# Patient Record
Sex: Female | Born: 2000
Health system: Southern US, Community
[De-identification: ages and names within clinical notes are randomized; demographics above are authoritative.]

## PROBLEM LIST (undated history)

## (undated) DIAGNOSIS — J302 Other seasonal allergic rhinitis: Secondary | ICD-10-CM

## (undated) DIAGNOSIS — Z9109 Other allergy status, other than to drugs and biological substances: Secondary | ICD-10-CM

## (undated) DIAGNOSIS — K5900 Constipation, unspecified: Secondary | ICD-10-CM

## (undated) HISTORY — DX: Other seasonal allergic rhinitis: J30.2

## (undated) HISTORY — PX: OTHER SURGICAL HISTORY: SHX169

## (undated) HISTORY — DX: Constipation, unspecified: K59.00

## (undated) HISTORY — DX: Other allergy status, other than to drugs and biological substances: Z91.09

---
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
## 2018-05-28 ENCOUNTER — Telehealth: Payer: Self-pay | Admitting: Family Medicine

## 2018-05-28 NOTE — Telephone Encounter (Signed)
Copied from CRM [phone]. Topic: Appointment Scheduling - Scheduling Inquiry for Clinic >> May 27, 2018  4:00 PM Eston Mouldavis, Cheri B wrote: Reason for CRM: PT's mother would like to have a cpe apt with Dr Carmelia RollerWendling before Wednesday for school  but I do not see any availability.

## 2018-05-28 NOTE — Telephone Encounter (Signed)
Called Patient Mother and requested if you would approved of another provider would doing a CPE for this patient.

## 2018-05-28 NOTE — Telephone Encounter (Signed)
OK. 11:15 or 2:15 Mon would work. TY.

## 2018-05-31 ENCOUNTER — Encounter: Payer: Self-pay | Admitting: Family Medicine

## 2018-05-31 ENCOUNTER — Ambulatory Visit (INDEPENDENT_AMBULATORY_CARE_PROVIDER_SITE_OTHER): Payer: BLUE CROSS/BLUE SHIELD | Admitting: Family Medicine

## 2018-05-31 VITALS — BP 118/70 | HR 70 | Temp 98.8°F | Ht 64.0 in | Wt 125.2 lb

## 2018-05-31 DIAGNOSIS — Z0000 Encounter for general adult medical examination without abnormal findings: Secondary | ICD-10-CM | POA: Diagnosis not present

## 2018-05-31 DIAGNOSIS — Z23 Encounter for immunization: Secondary | ICD-10-CM

## 2018-05-31 DIAGNOSIS — Z114 Encounter for screening for human immunodeficiency virus [HIV]: Secondary | ICD-10-CM

## 2018-05-31 NOTE — Progress Notes (Signed)
Pre visit review using our clinic review tool, if applicable. No additional management support is needed unless otherwise documented below in the visit note. 

## 2018-05-31 NOTE — Progress Notes (Signed)
Chief Complaint  Patient presents with  . Annual Exam     Well Woman Susan Galloway is here for a complete physical.   Her last physical was >1 year ago.  Current diet: in general, a "healthy" diet. Current exercise: Zumba, dancing. Weight is stable and she denies daytime fatigue. No LMP recorded. Seatbelt? Yes  Health Maintenance Tetanus- Yes HIV screening- No  Past Medical History:  Diagnosis Date  . Constipation   . Environmental allergies   . Seasonal allergies      Past Surgical History:  Procedure Laterality Date  . Dental surgery      Medications  Takes no meds routinely.    Allergies Allergies  Allergen Reactions  . Sulfa Antibiotics Hives    Review of Systems: Constitutional:  no unexpected weight changes Eye:  no recent significant change in vision Ear/Nose/Mouth/Throat:  Ears:  no tinnitus or vertigo and no recent change in hearing Nose/Mouth/Throat:  no complaints of nasal congestion, no sore throat Cardiovascular: no chest pain Respiratory:  no cough and no shortness of breath Gastrointestinal:  no abdominal pain, no change in bowel habits GU:  Female: negative for dysuria or pelvic pain Musculoskeletal/Extremities:  no pain of the joints Integumentary (Skin/Breast):  no abnormal skin lesions reported Neurologic:  no headaches Endocrine:  denies fatigue Hematologic/Lymphatic:  No areas of easy bleeding  Exam BP 118/70 (BP Location: Left Arm, Patient Position: Sitting, Cuff Size: Normal)   Pulse 70   Temp 98.8 F (37.1 C) (Oral)   Ht 5\' 4"  (1.626 m)   Wt 125 lb 4 oz (56.8 kg)   SpO2 98%   BMI 21.50 kg/m  General:  well developed, well nourished, in no apparent distress Skin:  no significant moles, warts, or growths Head:  no masses, lesions, or tenderness Eyes:  pupils equal and round, sclera anicteric without injection Ears:  canals without lesions, TMs shiny without retraction, no obvious effusion, no erythema Nose:  nares patent,  septum midline, mucosa normal, and no drainage or sinus tenderness Throat/Pharynx:  lips and gingiva without lesion; tongue and uvula midline; non-inflamed pharynx; no exudates or postnasal drainage Neck: neck supple without adenopathy, thyromegaly, or masses Lungs:  clear to auscultation, breath sounds equal bilaterally, no respiratory distress Cardio:  regular rate and rhythm, no bruits, no LE edema Abdomen:  abdomen soft, nontender; bowel sounds normal; no masses or organomegaly Genital: Defer to GYN Musculoskeletal:  symmetrical muscle groups noted without atrophy or deformity Extremities:  no clubbing, cyanosis, or edema, no deformities, no skin discoloration Neuro:  gait normal; deep tendon reflexes normal and symmetric Psych: well oriented with normal range of affect and appropriate judgment/insight  Assessment and Plan  Well adult exam - Plan: Lipid panel, CBC, Comprehensive metabolic panel  Screening for HIV (human immunodeficiency virus) - Plan: HIV antibody   Well 17 y.o. female. Counseled on diet and exercise. Update Meningitis and HPV vaccine. Should be good to go for [place]. Other orders as above. Follow up in 1 yr pending above. The patient voiced understanding and agreement to the plan.  Jilda Rocheicholas Paul [place], DO 05/31/18 3:24 PM

## 2018-05-31 NOTE — Telephone Encounter (Signed)
Call patient and adv per PCP approved. Patient stts she would like to take that appointment.

## 2018-05-31 NOTE — Patient Instructions (Addendum)
Keep up the good work.  Have a great time in [place]! Stay safe.  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  Give us 2-3 business days to get the results of your labs back. If labs are normal, you will likely receive a letter in the mail unless you have MyChart. This can take longer than 2-3 business days.   Let us know if you need anything.

## 2018-05-31 NOTE — Addendum Note (Signed)
Addended by: Scharlene GlossEWING, ROBIN B on: 05/31/2018 03:46 PM   Modules accepted: Orders

## 2018-06-01 LAB — LIPID PANEL
Cholesterol: 152 mg/dL (ref 0–200)
HDL: 67.9 mg/dL (ref >39.00)
LDL Cholesterol: 68 mg/dL (ref 0–99)
NonHDL: 84.05
Total CHOL/HDL Ratio: 2
Triglycerides: 79 mg/dL (ref 0.0–149.0)
VLDL: 15.8 mg/dL (ref 0.0–40.0)

## 2018-06-01 LAB — CBC
HCT: 39.7 % (ref 36.0–49.0)
Hemoglobin: 12.8 g/dL (ref 12.0–16.0)
MCHC: 32.2 g/dL (ref 31.0–37.0)
MCV: 81.9 fl (ref 78.0–98.0)
Platelets: 276 10*3/uL (ref 150.0–575.0)
RBC: 4.84 Mil/uL (ref 3.80–5.70)
RDW: 15.4 % (ref 11.4–15.5)
WBC: 7.7 10*3/uL (ref 4.5–13.5)

## 2018-06-01 LAB — COMPREHENSIVE METABOLIC PANEL
ALT: 7 U/L (ref 0–35)
AST: 14 U/L (ref 0–37)
Albumin: 4.4 g/dL (ref 3.5–5.2)
Alkaline Phosphatase: 29 U/L — ABNORMAL LOW (ref 47–119)
BUN: 17 mg/dL (ref 6–23)
CO2: 29 meq/L (ref 19–32)
Calcium: 9.9 mg/dL (ref 8.4–10.5)
Chloride: 104 meq/L (ref 96–112)
Creatinine, Ser: 0.81 mg/dL (ref 0.40–1.20)
GFR: 97.01 mL/min (ref >60.00)
Glucose, Bld: 78 mg/dL (ref 70–99)
Potassium: 4.7 meq/L (ref 3.5–5.1)
Sodium: 140 meq/L (ref 135–145)
Total Bilirubin: 1.5 mg/dL — ABNORMAL HIGH (ref 0.3–1.2)
Total Protein: 7 g/dL (ref 6.0–8.3)

## 2018-06-01 LAB — HIV ANTIBODY (ROUTINE TESTING W REFLEX): HIV 1&2 Ab, 4th Generation: NONREACTIVE

## 2018-10-18 ENCOUNTER — Encounter: Payer: Self-pay | Admitting: Family Medicine

## 2018-10-18 ENCOUNTER — Ambulatory Visit (INDEPENDENT_AMBULATORY_CARE_PROVIDER_SITE_OTHER): Payer: BLUE CROSS/BLUE SHIELD | Admitting: Family Medicine

## 2018-10-18 VITALS — BP 110/80 | HR 99 | Temp 98.7°F | Ht 64.0 in | Wt 128.1 lb

## 2018-10-18 DIAGNOSIS — L709 Acne, unspecified: Secondary | ICD-10-CM

## 2018-10-18 MED ORDER — CLINDAMYCIN PHOSPHATE 1 % EX GEL: Freq: Two times a day (BID) | CUTANEOUS | 1 refills | Status: DC

## 2018-10-18 NOTE — Progress Notes (Signed)
Pre visit review using our clinic review tool, if applicable. No additional management support is needed unless otherwise documented below in the visit note. 

## 2018-10-18 NOTE — Patient Instructions (Addendum)
Don't pick at your acne!  Stay on the Benzoyl peroxide.  Let us know if you need anything.  Acne Acne is a skin problem that causes pimples. Acne occurs when the pores in the skin get blocked. The pores may become infected with bacteria, or they may become red, sore, and swollen. Acne is a common skin problem, especially for teenagers. Acne usually goes away over time. What are the causes? Each pore contains an oil gland. Oil glands make an oily substance that is called sebum. Acne happens when these glands get plugged with sebum, dead skin cells, and dirt. Then, the bacteria that are normally found in the oil glands multiply and cause inflammation. Acne is commonly triggered by changes in your hormones. These hormonal changes can cause the oil glands to get bigger and to make more sebum. Factors that can make acne worse include:  Hormone changes during: ? Adolescence. ? Women's menstrual cycles. ? Pregnancy.  Oil-based cosmetics and hair products.  Harshly scrubbing the skin.  Strong soaps.  Stress.  Hormone problems that are due to certain diseases.  Long or oily hair rubbing against the skin.  Certain medicines.  Pressure from headbands, backpacks, or shoulder pads.  Exposure to certain oils and chemicals.  What increases the risk? This condition is more likely to develop in:  Teenagers.  People who have a family history of acne.  What are the signs or symptoms? Acne often occurs on the face, neck, chest, and upper back. Symptoms include:  Small, red bumps (pimples or papules).  Whiteheads.  Blackheads.  Small, pus-filled pimples (pustules).  Big, red pimples or pustules that feel tender.  More severe acne can cause:  An infected area that contains a collection of pus (abscess).  Hard, painful, fluid-filled sacs (cysts).  Scars.  How is this diagnosed? This condition is diagnosed with a medical history and physical exam. Blood tests may also be  done. How is this treated? Treatment for this condition can vary depending on the severity of your acne. Treatment may include:  Creams and lotions that prevent oil glands from clogging.  Creams and lotions that treat or prevent infections and inflammation.  Antibiotic medicines that are applied to the skin or taken as a pill.  Pills that decrease sebum production.  Birth control pills.  Light or laser treatments.  Surgery.  Injections of medicine into the affected areas.  Chemicals that cause peeling of the skin.  Your health care provider will also recommend the best way to take care of your skin. Good skin care is the most important part of treatment. Follow these instructions at home: Skin care Take care of your skin as told by your health care provider. You may be told to do these things:  Wash your skin gently at least two times each day, as well as: ? After you exercise. ? Before you go to bed.  Use mild soap.  Apply a water-based skin moisturizer after you wash your skin.  Use a sunscreen or sunblock with SPF 30 or greater. This is especially important if you are using acne medicines.  Choose cosmetics that will not plug your oil glands (are noncomedogenic).  Medicines  Take over-the-counter and prescription medicines only as told by your health care provider.  If you were prescribed an antibiotic medicine, apply or take it as told by your health care provider. Do not stop taking the antibiotic even if your condition improves. General instructions  Keep your hair clean and off  of your face. If you have oily hair, shampoo your hair regularly or daily.  Avoid leaning your chin or forehead against your hands.  Avoid wearing tight headbands or hats.  Avoid picking or squeezing your pimples. That can make your acne worse and cause scarring.  Keep all follow-up visits as told by your health care provider. This is important.  Shave gently and only when  necessary.  Keep a food journal to figure out if any foods are linked with your acne. Contact a health care provider if:  Your acne is not better after eight weeks.  Your acne gets worse.  You have a large area of skin that is red or tender.  You think that you are having side effects from any acne medicine. This information is not intended to replace advice given to you by your health care provider. Make sure you discuss any questions you have with your health care provider. Document Released: 12/12/2000 Document Revised: 08/15/2016 Document Reviewed: 02/21/2015 Elsevier Interactive Patient Education  [employer].

## 2018-10-18 NOTE — Progress Notes (Signed)
Chief Complaint  Patient presents with  . Acne    shoulders and back    Susan Galloway is a 17 y.o. female here for a skin complaint.  Duration: 3 weeks got worse after brother's wedding Location: back and shoulders Acne New soaps/lotions/topicals/detergents? No Other associated symptoms: sweating, stress Therapies tried thus far: benzoyl peroxide  ROS:  Const: No fevers Skin: As noted in HPI  Past Medical History:  Diagnosis Date  . Constipation   . Environmental allergies   . Seasonal allergies     BP 110/80 (BP Location: Left Arm, Patient Position: Sitting, Cuff Size: Normal)   Pulse 99   Temp 98.7 F (37.1 C) (Oral)   Ht 5\' 4"  (1.626 m)   Wt 128 lb 2 oz (58.1 kg)   SpO2 98%   BMI 21.99 kg/m  Gen: awake, alert, appearing stated age Lungs: No accessory muscle use Skin: acne noted on upper back and shoulders, open comedones. Scarring noted - pt admits she was picking at those areas. No drainage, erythema, TTP, fluctuance, excoriation Psych: Age appropriate judgment and insight  Acne, unspecified acne type - Plan: clindamycin (CLINDAGEL) 1 % gel  Orders as above.  Cont BP. Do not pick at lesions. F/u in 1 mo to reck. The patient voiced understanding and agreement to the plan.  Susan Roche [place], DO 10/18/18 3:46 PM

## 2018-11-18 ENCOUNTER — Encounter: Payer: Self-pay | Admitting: Family Medicine

## 2018-11-18 ENCOUNTER — Ambulatory Visit (INDEPENDENT_AMBULATORY_CARE_PROVIDER_SITE_OTHER): Payer: BLUE CROSS/BLUE SHIELD | Admitting: Family Medicine

## 2018-11-18 VITALS — BP 108/80 | HR 77 | Temp 98.6°F | Ht 64.0 in | Wt 127.4 lb

## 2018-11-18 DIAGNOSIS — L709 Acne, unspecified: Secondary | ICD-10-CM | POA: Diagnosis not present

## 2018-11-18 MED ORDER — MINOCYCLINE HCL 100 MG PO TABS: 100.0000 mg | ORAL_TABLET | Freq: Two times a day (BID) | ORAL | 1 refills | Status: DC

## 2018-11-18 NOTE — Progress Notes (Signed)
Pre visit review using our clinic review tool, if applicable. No additional management support is needed unless otherwise documented below in the visit note. 

## 2018-11-18 NOTE — Progress Notes (Signed)
Chief Complaint  Patient presents with  . Acne    Subjective: Patient is a 17 y.o. female here for f/u acne.  Did use Clindagel, was expensive so used niece's supply. Feels it did get better overall. No AE's. Reports compliance. Still doing benzoyl peroxide washes daily.  No new acne on shoulders/upper back. +breakout over face.   ROS: Skin: +acne  Past Medical History:  Diagnosis Date  . Constipation   . Environmental allergies   . Seasonal allergies     Objective: BP 108/80 (BP Location: Left Arm, Patient Position: Sitting, Cuff Size: Normal)   Pulse 77   Temp 98.6 F (37 C) (Oral)   Ht 5\' 4"  (1.626 m)   Wt 127 lb 6 oz (57.8 kg)   SpO2 98%   BMI 21.86 kg/m  General: Awake, appears stated age Skin: +hyperpigmented patches/macules on upper back, small comedones over same area, face and shoulders Lungs: No accessory muscle use Psych: Age appropriate judgment and insight, normal affect and mood  Assessment and Plan: Acne, unspecified acne type - Plan: minocycline (DYNACIN) 100 MG tablet  Stop clindagel 2/2 cost. Will try PO mino. Scar/Vit E over post inflammatory hyperpigmentation. Cont benz peroxide washes. F/u in 6 weeks. The patient voiced understanding and agreement to the plan.  Jilda Rocheicholas [place], DO 11/18/18  2:47 PM

## 2018-11-18 NOTE — Patient Instructions (Addendum)
Let me know if the medicine is too expensive.  Continue the washes.  Practice good hygiene.  Try topical Vit E or scar cream/oil over the dark patches. This could take several months to fully resolve.   Let Susan Galloway know if you need anything.

## 2018-12-30 ENCOUNTER — Ambulatory Visit: Payer: BLUE CROSS/BLUE SHIELD | Admitting: Family Medicine

## 2019-01-13 ENCOUNTER — Ambulatory Visit (INDEPENDENT_AMBULATORY_CARE_PROVIDER_SITE_OTHER): Payer: BLUE CROSS/BLUE SHIELD | Admitting: Family Medicine

## 2019-01-13 ENCOUNTER — Encounter: Payer: Self-pay | Admitting: Family Medicine

## 2019-01-13 VITALS — BP 102/72 | HR 76 | Temp 98.4°F | Ht 64.0 in | Wt 126.2 lb

## 2019-01-13 DIAGNOSIS — R0789 Other chest pain: Secondary | ICD-10-CM

## 2019-01-13 DIAGNOSIS — R071 Chest pain on breathing: Secondary | ICD-10-CM | POA: Diagnosis not present

## 2019-01-13 MED ORDER — CYCLOBENZAPRINE HCL 10 MG PO TABS: 5.0000 mg | ORAL_TABLET | Freq: Three times a day (TID) | ORAL | 0 refills | Status: DC | PRN

## 2019-01-13 MED ORDER — MELOXICAM 15 MG PO TABS: 15.0000 mg | ORAL_TABLET | Freq: Every day | ORAL | 0 refills | Status: DC

## 2019-01-13 NOTE — Progress Notes (Signed)
Musculoskeletal Exam  Patient: Susan Galloway DOB: May 24, 2001  DOS: 01/13/2019  SUBJECTIVE:  Chief Complaint:   Chief Complaint  Patient presents with  . Muscle Pain    Susan Galloway is a 18 y.o.  female for evaluation and treatment of central chest pain.   Onset:  1 week ago. Pulling couches.   Location: central  Character:  sharp  Progression of issue:  is unchanged Associated symptoms: sometimes hurts to take a deep breath Treatment: to date has been hot pad, hot tea, cooling pad.  Neurovascular symptoms: no  ROS: Musculoskeletal/Extremities: +central chest pain  Past Medical History:  Diagnosis Date  . Constipation   . Environmental allergies   . Seasonal allergies     Objective: VITAL SIGNS: BP 102/72 (BP Location: Left Arm, Patient Position: Sitting, Cuff Size: Normal)   Pulse 76   Temp 98.4 F (36.9 C) (Oral)   Ht 5\' 4"  (1.626 m)   Wt 126 lb 4 oz (57.3 kg)   SpO2 98%   BMI 21.67 kg/m  Constitutional: Well formed, well developed. No acute distress. Cardiovascular: Brisk cap refill Thorax & Lungs: No accessory muscle use Musculoskeletal: chest.   Normal active range of motion: Yes, of UE's.   Normal passive range of motion: yes of UE's Tenderness to palpation: yes, sternum and costochondral junction, moreso on L Deformity: no Ecchymosis: no No crepitus Neurologic: Normal sensory function. No focal deficits noted. DTR's equal and symmetry in LE's. No clonus. Psychiatric: Normal mood. Age appropriate judgment and insight. Alert & oriented x 3.    Assessment:  Costochondral chest pain - Plan: meloxicam (MOBIC) 15 MG tablet, cyclobenzaprine (FLEXERIL) 10 MG tablet  Plan: Orders as above. Warning signs and symptoms of Flexeril verbalized and written down in AVS.  Stretch. Heat. Ice.  F/u prn. The patient voiced understanding and agreement to the plan.   [name] [place], DO 01/13/19  7:56 AM

## 2019-01-13 NOTE — Patient Instructions (Addendum)
Stretch your pecs daily or at least every other day. Hold for 30 seconds and repeat 2 times. Do each side.  OK to take Tylenol 1000 mg (2 extra strength tabs) or 975 mg (3 regular strength tabs) every 6 hours as needed.  Ice/cold pack over area for 10-15 min twice daily.  Heat (pad or rice pillow in microwave) over affected area, 10-15 minutes twice daily.   Take Flexeril (cyclobenzaprine) 1-2 hours before planned bedtime. If it makes you drowsy, do not take during the day. You can try half a tab the following night.  Let us know if you need anything.

## 2019-01-13 NOTE — Progress Notes (Signed)
Pre visit review using our clinic review tool, if applicable. No additional management support is needed unless otherwise documented below in the visit note. 

## 2019-02-06 ENCOUNTER — Other Ambulatory Visit: Payer: Self-pay | Admitting: Family Medicine

## 2019-02-06 DIAGNOSIS — R0789 Other chest pain: Secondary | ICD-10-CM

## 2019-02-06 DIAGNOSIS — R071 Chest pain on breathing: Principal | ICD-10-CM

## 2019-03-09 ENCOUNTER — Ambulatory Visit: Payer: Self-pay | Admitting: *Deleted

## 2019-03-09 NOTE — Telephone Encounter (Signed)
Intermittent non-productive cough ,and sore throat which started 03/08/2019, body aches which started on 03/09/2019,  symptoms of UTI (burning with urination); she is currently on her period; the urinary symptoms started 03/07/2019; she has not checked her temperature; she also complains of right ear pain; the pt says that UTI discomfort is the worst of her symptoms; recommendations made per nurse triage protocol; pt offered and accepted appointment with Dr Carmelia Roller, [facility], 03/10/2019 at 0815; she verbalized understanding; will route to office for notification.   Reason for Disposition . Earache also present . > 2 UTI's in last year  Answer Assessment - Initial Assessment Questions 1. ONSET: "When did the throat start hurting?" (Hours or days ago)      03/07/2019 2. SEVERITY: "How bad is the sore throat?" (Scale 1-10; mild, moderate or severe)   - MILD (1-3):  doesn't interfere with eating or normal activities   - MODERATE (4-7): interferes with eating some solids and normal activities   - SEVERE (8-10):  excruciating pain, interferes with most normal activities   - SEVERE DYSPHAGIA: can't swallow liquids, drooling     Moderate; pain when pt talks and when she eats 3. STREP EXPOSURE: "Has there been any exposure to strep within the past week?" If so, ask: "What type of contact occurred?"     Exposed to cousin had strep throat 4.  VIRAL SYMPTOMS: "Are there any symptoms of a cold, such as a runny nose, cough, hoarse voice or red eyes?"     Body aches and cough 5. FEVER: "Do you have a fever?" If so, ask: "What is your temperature, how was it measured, and when did it start?"     Not taken 6. PUS ON THE TONSILS: "Is there pus on the tonsils in the back of your throat?"     Pt says nothing on tonsils, and no swelling 7. OTHER SYMPTOMS: "Do you have any other symptoms?" (e.g., difficulty breathing, headache, rash)    Headache rated 10 out of 10; insomnia pt says that she has slept 2 hours last  night and 4 hours on 03/07/2019  (says burning makes it hard for her to sleep) 8. PREGNANCY: "Is there any chance you are pregnant?" "When was your last menstrual period?"    LMP 03/06/2019; pt currently on periond  Answer Assessment - Initial Assessment Questions 1. SEVERITY: "How bad is the pain?"  (e.g., Scale 1-10; mild, moderate, or severe)   - MILD (1-3): complains slightly about urination hurting   - MODERATE (4-7): interferes with normal activities     - SEVERE (8-10): excruciating, unwilling or unable to urinate because of the pain      Rated moderate 2. FREQUENCY: "How many times have you had painful urination today?"      Everytime she urinated (5 times) 3. PATTERN: "Is pain present every time you urinate or just sometimes?"      Every time 4. ONSET: "When did the painful urination start?"      03/07/2019 5. FEVER: "Do you have a fever?" If so, ask: "What is your temperature, how was it measured, and when did it start?"     Not sure 6. PAST UTI: "Have you had a urine infection before?" If so, ask: "When was the last time?" and "What happened that time?"      Yes; given antibiotics 7. CAUSE: "What do you think is causing the painful urination?"  (e.g., UTI, scratch, Herpes sore)     UTI 8. OTHER SYMPTOMS: "  Do you have any other symptoms?" (e.g., flank pain, vaginal discharge, genital sores, urgency, blood in urine)     Urinary frequency 9. PREGNANCY: "Is there any chance you are pregnant?" "When was your last menstrual period?"     No pt currently on menses  Protocols used: SORE THROAT-A-AH, URINATION PAIN - FEMALE-A-AH

## 2019-03-10 ENCOUNTER — Ambulatory Visit (INDEPENDENT_AMBULATORY_CARE_PROVIDER_SITE_OTHER): Payer: BLUE CROSS/BLUE SHIELD | Admitting: Family Medicine

## 2019-03-10 ENCOUNTER — Other Ambulatory Visit: Payer: Self-pay

## 2019-03-10 ENCOUNTER — Encounter: Payer: Self-pay | Admitting: Family Medicine

## 2019-03-10 VITALS — BP 108/70 | HR 86 | Temp 98.4°F | Ht 64.0 in | Wt 125.2 lb

## 2019-03-10 DIAGNOSIS — J029 Acute pharyngitis, unspecified: Secondary | ICD-10-CM

## 2019-03-10 DIAGNOSIS — N3000 Acute cystitis without hematuria: Secondary | ICD-10-CM

## 2019-03-10 LAB — POCT RAPID STREP A (OFFICE): Rapid Strep A Screen: NEGATIVE

## 2019-03-10 MED ORDER — CEPHALEXIN 500 MG PO CAPS: 500.0000 mg | ORAL_CAPSULE | Freq: Two times a day (BID) | ORAL | 0 refills | Status: AC

## 2019-03-10 NOTE — Progress Notes (Signed)
CC: Dysuria  Susan Galloway is a 18 y.o. female here for possible UTI.  Duration: 2 days. Symptoms: urinary frequency, urinary hesitancy, urinary retention and dysuria Denies: hematuria, fever, nausea, vomiting, urinary incontinence and flank pain, vaginal discharge Hx of recurrent UTI? No Denies new sexual partners. Hx of UTI, feels similar.   +ST for 2 d also. Exposed to nephew who was dx'd w strep. No fevers, myalgias, sob, wheezing, cough, ear pain/drainage, itchy eyes. Having rhinorrhea, congestion, watery eyes.  ROS:  Constitutional: denies fever GU: As noted in HPI  Past Medical History:  Diagnosis Date  . Constipation   . Environmental allergies   . Seasonal allergies     BP 108/70 (BP Location: Left Arm, Patient Position: Sitting, Cuff Size: Normal)   Pulse 86   Temp 98.4 F (36.9 C) (Oral)   Ht 5\' 4"  (1.626 m)   Wt 125 lb 3 oz (56.8 kg)   SpO2 96%   BMI 21.49 kg/m  General: Awake, alert, appears stated age Heart: RRR HEENT: ears and nose neg, mild erythema in pharynx, no tonsillar exudate or erythema Lungs: CTAB, normal respiratory effort, no accessory muscle usage Abd: BS+, soft, NT, ND, no masses or organomegaly MSK: No CVA tenderness, neg Lloyd's sign Psych: Age appropriate judgment and insight  Acute cystitis without hematuria - Plan: cephALEXin (KEFLEX) 500 MG capsule  Sore throat - Plan: POCT rapid strep A  Orders as above. Stay hydrated. Seek immediate care if pt starts to develop fevers, new/worsening symptoms, uncontrollable N/V. Keflex should cover for strep, but I don't think she has that.  F/u prn. The patient voiced understanding and agreement to the plan.  [name] [place], DO 03/10/19 10:52 AM

## 2019-06-17 ENCOUNTER — Encounter: Payer: Self-pay | Admitting: Family Medicine

## 2019-06-17 ENCOUNTER — Other Ambulatory Visit: Payer: Self-pay

## 2019-06-17 ENCOUNTER — Ambulatory Visit (INDEPENDENT_AMBULATORY_CARE_PROVIDER_SITE_OTHER): Payer: BC Managed Care – PPO | Admitting: Family Medicine

## 2019-06-17 VITALS — BP 120/68 | HR 104 | Temp 98.4°F | Ht 64.0 in | Wt 131.0 lb

## 2019-06-17 DIAGNOSIS — N3000 Acute cystitis without hematuria: Secondary | ICD-10-CM | POA: Diagnosis not present

## 2019-06-17 DIAGNOSIS — K219 Gastro-esophageal reflux disease without esophagitis: Secondary | ICD-10-CM

## 2019-06-17 DIAGNOSIS — F4522 Body dysmorphic disorder: Secondary | ICD-10-CM

## 2019-06-17 MED ORDER — PANTOPRAZOLE SODIUM 40 MG PO TBEC: 40.0000 mg | DELAYED_RELEASE_TABLET | Freq: Every day | ORAL | 1 refills | Status: DC

## 2019-06-17 MED ORDER — NITROFURANTOIN MONOHYD MACRO 100 MG PO CAPS: 100.0000 mg | ORAL_CAPSULE | Freq: Two times a day (BID) | ORAL | 0 refills | Status: AC

## 2019-06-17 MED ORDER — CEFTRIAXONE SODIUM 1 G IJ SOLR
1.0000 g | Freq: Once | INTRAMUSCULAR | Status: AC
  Administered 2019-06-17: 1 g via INTRAMUSCULAR

## 2019-06-17 NOTE — Patient Instructions (Addendum)
Stay hydrated.   Warning signs/symptoms: Uncontrollable nausea/vomiting, fevers, worsening symptoms despite treatment, confusion.  Give [name] around 2 business days to get culture back to you.  Please consider counseling. Contact [phone] to schedule an appointment or inquire about cost/insurance coverage.   Try to get 7-8 hours of sleep nightly.   Let us know if you need anything.

## 2019-06-17 NOTE — Progress Notes (Signed)
Chief Complaint  Patient presents with  . Dysuria    one day  . Extremity Weakness    Susan Galloway is a 18 y.o. female here for possible UTI.  Duration: 1 day. Symptoms: urinary frequency, urinary hesitancy, urinary retention and burning Denies: hematuria, fever, nausea, vomiting and urinary incontinence, vaginal discharge Hx of recurrent UTI? No Denies new sexual partners.  She has a history of reflux.  She will have burning and sometimes coughing after every meal.  Certain spicy and acidic meals seem to make things worse.  She has never been on any medication.  No unintentional weight loss, sometimes will regurgitate food.  She has a history of poor eating habits in public.  She is a member of the dance team.  She believes that she is overweight.  She does not intentionally starve herself or regurgitate food.  She will avoid eating in public though.  ROS:  Constitutional: denies fever GU: As noted in HPI  Past Medical History:  Diagnosis Date  . Constipation   . Environmental allergies   . Seasonal allergies     BP 120/68 (BP Location: Left Arm, Patient Position: Sitting, Cuff Size: Normal)   Pulse (!) 104   Temp 98.4 F (36.9 C) (Oral)   Ht 5\' 4"  (1.626 m)   Wt 131 lb (59.4 kg)   SpO2 96%   BMI 22.49 kg/m  General: Awake, alert, appears stated age Heart: RRR Lungs: CTAB, normal respiratory effort, no accessory muscle usage Abd: BS+, soft, + suprapubic tenderness to palpation, ND, no masses or organomegaly MSK: + Left sided CVA tenderness, neg Lloyd's sign Psych: Age appropriate judgment and insight  Acute cystitis without hematuria - Plan: nitrofurantoin, macrocrystal-monohydrate, (MACROBID) 100 MG capsule, Urine Culture, cefTRIAXone (ROCEPHIN) injection 1 g, given CVA tenderness, will cover with 1 g of Rocephin for pyelonephritis.  Start Macrobid tomorrow to check culture.  Stay hydrated.  Gastroesophageal reflux disease, esophagitis presence not specified - Plan:  pantoprazole (PROTONIX) 40 MG tablet, 60-day trial of PPI, follow-up in 2 months.  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  Offered oral medication however she declined.  Given her view that she is overweight despite not being so, I think she would benefit greatly from counseling.  Seek immediate care if pt starts to develop fevers, new/worsening symptoms, uncontrollable N/V. F/u in 60 d. The patient voiced understanding and agreement to the plan.  [place], DO 06/17/19 4:27 PM

## 2019-06-19 LAB — URINE CULTURE
MICRO NUMBER:: 588446
SPECIMEN QUALITY:: ADEQUATE

## 2019-10-06 ENCOUNTER — Emergency Department (HOSPITAL_COMMUNITY)
Admission: EM | Admit: 2019-10-06 | Discharge: 2019-10-07 | Disposition: A | Discharge status: Home / Self Care | Payer: BC Managed Care – PPO | Attending: Emergency Medicine | Admitting: Emergency Medicine

## 2019-10-06 ENCOUNTER — Other Ambulatory Visit: Payer: Self-pay

## 2019-10-06 ENCOUNTER — Encounter (HOSPITAL_COMMUNITY): Payer: Self-pay | Admitting: Emergency Medicine

## 2019-10-06 DIAGNOSIS — F419 Anxiety disorder, unspecified: Secondary | ICD-10-CM | POA: Insufficient documentation

## 2019-10-06 DIAGNOSIS — F410 Panic disorder [episodic paroxysmal anxiety] without agoraphobia: Secondary | ICD-10-CM | POA: Diagnosis not present

## 2019-10-06 DIAGNOSIS — Z79899 Other long term (current) drug therapy: Secondary | ICD-10-CM | POA: Diagnosis not present

## 2019-10-06 LAB — BASIC METABOLIC PANEL
Anion gap: 14 (ref 5–15)
BUN: 12 mg/dL (ref 6–20)
CO2: 21 mmol/L — ABNORMAL LOW (ref 22–32)
Calcium: 9.6 mg/dL (ref 8.9–10.3)
Chloride: 102 mmol/L (ref 98–111)
Creatinine, Ser: 0.85 mg/dL (ref 0.44–1.00)
GFR calc Af Amer: >60 mL/min (ref >60)
GFR calc non Af Amer: >60 mL/min (ref >60)
Glucose, Bld: 86 mg/dL (ref 70–99)
Potassium: 3.9 mmol/L (ref 3.5–5.1)
Sodium: 137 mmol/L (ref 135–145)

## 2019-10-06 LAB — T4, FREE: Free T4: 1.01 ng/dL (ref 0.61–1.12)

## 2019-10-06 LAB — CBC WITH DIFFERENTIAL/PLATELET
Abs Immature Granulocytes: 0.02 10*3/uL (ref 0.00–0.07)
Basophils Absolute: 0.1 10*3/uL (ref 0.0–0.1)
Basophils Relative: 1 %
Eosinophils Absolute: 0 10*3/uL (ref 0.0–0.5)
Eosinophils Relative: 0 %
HCT: 42.5 % (ref 36.0–46.0)
Hemoglobin: 13.6 g/dL (ref 12.0–15.0)
Immature Granulocytes: 0 %
Lymphocytes Relative: 24 %
Lymphs Abs: 1.8 10*3/uL (ref 0.7–4.0)
MCH: 27.4 pg (ref 26.0–34.0)
MCHC: 32 g/dL (ref 30.0–36.0)
MCV: 85.7 fL (ref 80.0–100.0)
Monocytes Absolute: 0.3 10*3/uL (ref 0.1–1.0)
Monocytes Relative: 4 %
Neutro Abs: 5.3 10*3/uL (ref 1.7–7.7)
Neutrophils Relative %: 71 %
Platelets: 329 10*3/uL (ref 150–400)
RBC: 4.96 MIL/uL (ref 3.87–5.11)
RDW: 16.4 % — ABNORMAL HIGH (ref 11.5–15.5)
WBC: 7.5 10*3/uL (ref 4.0–10.5)
nRBC: 0 % (ref 0.0–0.2)

## 2019-10-06 LAB — TSH: TSH: 0.97 u[IU]/mL (ref 0.350–4.500)

## 2019-10-06 LAB — I-STAT BETA HCG BLOOD, ED (MC, WL, AP ONLY): I-stat hCG, quantitative: <5 m[IU]/mL (ref <5)

## 2019-10-06 MED ORDER — HYDROXYZINE HCL 10 MG PO TABS
10.0000 mg | ORAL_TABLET | Freq: Once | ORAL | Status: AC
  Administered 2019-10-06: 10 mg via ORAL
  Filled 2019-10-06: qty 1

## 2019-10-06 NOTE — ED Triage Notes (Signed)
Pt c/o feeling anxious, like she is having a panic attack. Hx of same. Reports she was at the store x 2 hours ago, felt flushed and like her heart was racing. Pt tearful in triage.

## 2019-10-06 NOTE — ED Provider Notes (Addendum)
[facility] EMERGENCY DEPARTMENT Provider Note   CSN: [id number] Arrival date & time: 10/06/19  1828     History   Chief Complaint Chief Complaint  Patient presents with  . Panic Attack    HPI Susan Galloway is a 18 y.o. female presents today for anxiety.  Patient reports that she was walking around stores with her niece when she had sudden onset palpitations and feeling warm.  She is unclear of what may have caused her to feel like this she reports generalized increased stress.  Patient reports that the symptoms have resolved since ED arrival over 3 hours ago.  Denies fever/chills, fall/injury, headache/vision changes, neck pain, cough/hemoptysis, abdominal pain, nausea/vomiting, diarrhea, extremity pain/swelling, history of blood clot, birth control use, family history of sudden death, drug/alcohol use, SI/HI, hallucinations or any additional concerns.     HPI  Past Medical History:  Diagnosis Date  . Constipation   . Environmental allergies   . Seasonal allergies     Patient Active Problem List   Diagnosis Date Noted  . 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 Acne 10/18/2018  . Chronic fatigue 11/11/2015  . Paresthesias 11/11/2015  . Gastroesophageal reflux disease 09/03/2015  . Allergic rhinitis, seasonal 04/09/2014    Past Surgical History:  Procedure Laterality Date  . Dental surgery       OB History    Gravida  0   Para  0   Term  0   Preterm  0   AB  0   Living  0     SAB  0   TAB  0   Ectopic  0   Multiple  0   Live Births               Home Medications    Prior to Admission medications   Medication Sig Start Date End Date Taking? Authorizing Provider  pantoprazole (PROTONIX) 40 MG tablet Take 1 tablet (40 mg total) by mouth daily. 06/17/19   Sharlene Dory, DO    Family History Family History  Problem Relation Age of Onset  . Arthritis Mother        Living  . Diabetes Mother   . Hypertension  Mother   . Hyperlipidemia Mother   . Thyroid disease Mother   . Hypothyroidism Mother   . Hypertension Father   . Hyperlipidemia Father        Living  . Kidney disease Sister   . Crohn's disease Brother   . Hyperlipidemia Brother        #2  . Hypertension Brother        #2    Social History Social History   Tobacco Use  . Smoking status: Never Smoker  . Smokeless tobacco: Never Used  Substance Use Topics  . Alcohol use: No    Alcohol/week: 0.0 standard drinks  . Drug use: No     Allergies   Sulfa antibiotics   Review of Systems Review of Systems Ten systems are reviewed and are negative for acute change except as noted in the HPI   Physical Exam Updated Vital Signs BP 126/74 (BP Location: Right Arm)   Pulse 99   Temp 98 F (36.7 C) (Oral)   Resp 16   LMP 09/29/2019   SpO2 99%   Physical Exam Constitutional:      General: She is not in acute distress.    Appearance: Normal appearance. She is well-developed. She is not ill-appearing or diaphoretic.  HENT:  Head: Normocephalic and atraumatic.     Right Ear: External ear normal.     Left Ear: External ear normal.     Nose: Nose normal.  Eyes:     General: Vision grossly intact. Gaze aligned appropriately.     Pupils: Pupils are equal, round, and reactive to light.  Neck:     Musculoskeletal: Normal range of motion.     Trachea: Trachea and phonation normal. No tracheal deviation.  Cardiovascular:     Rate and Rhythm: Normal rate and regular rhythm.     Pulses: Normal pulses.     Heart sounds: Normal heart sounds.  Pulmonary:     Effort: Pulmonary effort is normal. No respiratory distress.     Breath sounds: Normal breath sounds.  Abdominal:     General: There is no distension.     Palpations: Abdomen is soft.     Tenderness: There is no abdominal tenderness. There is no guarding or rebound.  Musculoskeletal: Normal range of motion.     Right lower leg: No edema.     Left lower leg: No edema.   Skin:    General: Skin is warm and dry.  Neurological:     Mental Status: She is alert.     GCS: GCS eye subscore is 4. GCS verbal subscore is 5. GCS motor subscore is 6.     Comments: Speech is clear and goal oriented, follows commands Major Cranial nerves without deficit, no facial droop Moves extremities without ataxia, coordination intact  Psychiatric:        Attention and Perception: Attention normal. She does not perceive auditory or visual hallucinations.        Mood and Affect: Mood and affect normal.        Speech: Speech normal.        Behavior: Behavior normal. Behavior is cooperative.        Thought Content: Thought content does not include homicidal or suicidal ideation.    ED Treatments / Results  Labs (all labs ordered are listed, but only abnormal results are displayed) Labs Reviewed  CBC WITH DIFFERENTIAL/PLATELET - Abnormal; Notable for the following components:      Result Value   RDW 16.4 (*)    All other components within normal limits  BASIC METABOLIC PANEL - Abnormal; Notable for the following components:   CO2 21 (*)    All other components within normal limits  T3, FREE  T4, FREE  RAPID URINE DRUG SCREEN, HOSP PERFORMED  TSH  I-STAT BETA HCG BLOOD, ED ([facility], [facility], [facility] ONLY)    EKG EKG Interpretation  Date/Time:  Thursday October 06 2019 21:39:12 EDT Ventricular Rate:  86 PR Interval:  106 QRS Duration: 86 QT Interval:  358 QTC Calculation: 428 R Axis:   77 Text Interpretation:  Sinus rhythm with marked sinus arrhythmia with short PR Nonspecific ST abnormality Abnormal ECG Since last tracing rate slower Confirmed by Jacalyn LefevreHaviland, Julie [phone]) on 10/06/2019 9:51:27 PM   Radiology No results found.  Procedures Procedures (including critical care time)  Medications Ordered in ED Medications  hydrOXYzine (ATARAX/VISTARIL) tablet 10 mg (10 mg Oral Given 10/06/19 2222)     Initial Impression / Assessment and Plan / ED Course  I have reviewed the  triage vital signs and the nursing notes.  Pertinent labs & imaging results that were available during my care of the patient were reviewed by me and considered in my medical decision making (see chart for details).  Beta-hCG negative CBC nonacute BMP nonacute EKG  Sinus rhythm with marked sinus arrhythmia with short PR Nonspecific ST abnormality Abnormal ECG Since last tracing rate slower Confirmed by Isla Pence [phone]) on 10/06/2019 9:51:27 PM - Patient reassessed resting comfortably no acute distress.  States improvement of anxiety following Vistaril today, no current complaints.  Thyroid panel still pending.  Patient asking to leave, she has a primary care provider.  I have encouraged patient to check her TSH, T3 and T4 on her MyChart account tomorrow and then to discuss the results with her primary care provider.  There is no indication to hold patient here in the ER or for any additional testing/treatment at this time.  Case discussed with Dr. Gilford Raid who agrees with discharge at this time and for patient to follow-up on thyroid studies with PCP as outpatient.  At this time there does not appear to be any evidence of an acute emergency medical condition and the patient appears stable for discharge with appropriate outpatient follow up. Diagnosis was discussed with patient who verbalizes understanding of care plan and is agreeable to discharge. I have discussed return precautions with patient  who verbalizes understanding of return precautions. Patient encouraged to follow-up with their PCP. All questions answered.  Patient has been discharged in good condition. - Addendum: T4 and TSH within normal limits.  T3 pending and will not result tonight.  Note: Portions of this report may have been transcribed using voice recognition software. Every effort was made to ensure accuracy; however, inadvertent computerized transcription errors may still be present. Final Clinical Impressions(s) / ED  Diagnoses   Final diagnoses:  Anxiety    ED Discharge Orders    None       Deliah Boston, PA-C 10/06/19 2357    Deliah Boston, PA-C 10/06/19 2358    Isla Pence, MD 10/15/19 [phone]

## 2019-10-06 NOTE — Discharge Instructions (Addendum)
You have been diagnosed today with Anxiety.  At this time there does not appear to be the presence of an emergent medical condition, however there is always the potential for conditions to change. Please read and follow the below instructions.  Please return to the Emergency Department immediately for any new or worsening symptoms. Please be sure to follow up with your Primary Care Provider within one week regarding your visit today; please call their office to schedule an appointment even if you are feeling better for a follow-up visit. Your thyroid studies are still pending.  Please check your MyChart account tomorrow morning for your results.  Please call your primary care provider tomorrow morning to discuss the results of your thyroid tests and to schedule a follow-up appointment in the next 3-4 days.  Get help right away if: You have thoughts of hurting yourself or others. You have chest pain or pass out You have symptoms of a panic attack. Do not drive yourself to the hospital. Have someone else drive you or call an ambulance. If you feel like you may hurt yourself or others, or have thoughts about taking your own life, get help right away. You can go to your nearest emergency department or call: Your local emergency services (911 in the [place] A suicide crisis helpline, such as the [place] at [phone]. This is open 24 hours a day. You have any new/concerning or worsening symptoms  Please read the additional information packets attached to your discharge summary.  Do not take your medicine if  develop an itchy rash, swelling in your mouth or lips, or difficulty breathing; call 911 and seek immediate emergency medical attention if this occurs.  Note: Portions of this text may have been transcribed using voice recognition software. Every effort was made to ensure accuracy; however, inadvertent computerized transcription errors may still be present.

## 2019-10-06 NOTE — ED Notes (Signed)
Pt is aware we need a urine sample. 

## 2019-10-07 ENCOUNTER — Ambulatory Visit: Payer: BC Managed Care – PPO | Admitting: Medical

## 2019-10-08 LAB — T3, FREE: T3, Free: 3.4 pg/mL (ref 2.0–4.4)

## 2019-10-10 ENCOUNTER — Ambulatory Visit (INDEPENDENT_AMBULATORY_CARE_PROVIDER_SITE_OTHER): Payer: BC Managed Care – PPO | Admitting: Family Medicine

## 2019-10-10 ENCOUNTER — Other Ambulatory Visit: Payer: Self-pay

## 2019-10-10 ENCOUNTER — Encounter: Payer: Self-pay | Admitting: Family Medicine

## 2019-10-10 VITALS — BP 102/62 | HR 109 | Temp 97.7°F | Ht 64.0 in | Wt 129.4 lb

## 2019-10-10 DIAGNOSIS — F411 Generalized anxiety disorder: Secondary | ICD-10-CM

## 2019-10-10 DIAGNOSIS — R0600 Dyspnea, unspecified: Secondary | ICD-10-CM | POA: Diagnosis not present

## 2019-10-10 MED ORDER — SERTRALINE HCL 50 MG PO TABS: 50.0000 mg | ORAL_TABLET | Freq: Every day | ORAL | 3 refills | Status: DC

## 2019-10-10 MED ORDER — ALBUTEROL SULFATE HFA 108 (90 BASE) MCG/ACT IN AERS: 2.0000 | INHALATION_SPRAY | Freq: Four times a day (QID) | RESPIRATORY_TRACT | 0 refills | Status: DC | PRN

## 2019-10-10 NOTE — Progress Notes (Signed)
Chief Complaint  Patient presents with  . Panic Attack    Subjective Susan Galloway presents for possible anxiety.  Over last 3 mo has had issues with increasing anxiety. Had a panic attack around 4 d ago. Was not doing anything in particular.  S/s's include racing heartbeat, racing thoughts, anxiety, sob, feelings of impending doom.  No thoughts of harming self or others. No self-medication with alcohol, prescription drugs or illicit drugs. Pt is not following with a counselor/psychologist.  ROS Psych: No homicidal or suicidal thoughts  Past Medical History:  Diagnosis Date  . Constipation   . Environmental allergies   . Seasonal allergies    Exam BP 102/62 (BP Location: Left Arm, Patient Position: Sitting, Cuff Size: Normal)   Pulse (!) 109   Temp 97.7 F (36.5 C) (Temporal)   Ht 5\' 4"  (1.626 m)   Wt 129 lb 6 oz (58.7 kg)   LMP 09/29/2019   SpO2 98%   BMI 22.21 kg/m  General:  well developed, well nourished, in no apparent distress Neck: neck supple without adenopathy, thyromegaly, or masses Lungs:  clear to auscultation, breath sounds equal bilaterally, no respiratory distress Cardio:  Tachycardic, regular rhythm without murmurs, heart sounds without clicks or rubs Psych: well oriented with normal range of affect and age-appropriate judgement/insight, alert and oriented x4.  Assessment and Plan  GAD (generalized anxiety disorder) - Plan: sertraline (ZOLOFT) 50 MG tablet  Dyspnea, unspecified type - Plan: albuterol (VENTOLIN HFA) 108 (90 Base) MCG/ACT inhaler  1- Cont counseling. Start SSRI, 1/2 tab daily for 2 weeks. 2- SABA for reassurance.  F/u in 6 weeks. The patient voiced understanding and agreement to the plan.  [place], DO 10/10/19 3:38 PM

## 2019-10-10 NOTE — Patient Instructions (Addendum)
Coping skills Choose 5 that work for you:  Take a deep breath  Count to 20  Read a book  Do a puzzle  Meditate  Bake  [place] outside  Call a friend  Listen to music  Take a walk  Color   Send a note  Take a bath  Watch a movie  Be alone in a quiet place  Pet an animal  Visit a friend  Journal  Exercise  Stretch   Aim to do some physical exertion for 150 minutes per week. This is typically divided into 5 days per week, 30 minutes per day. The activity should be enough to get your heart rate up. Anything is better than nothing if you have time constraints.  Let us know if you need anything.

## 2019-11-03 ENCOUNTER — Other Ambulatory Visit: Payer: Self-pay | Admitting: Family Medicine

## 2019-11-03 DIAGNOSIS — R0600 Dyspnea, unspecified: Secondary | ICD-10-CM

## 2019-11-13 ENCOUNTER — Telehealth: Payer: BC Managed Care – PPO | Admitting: Physician Assistant

## 2019-11-13 DIAGNOSIS — R300 Dysuria: Secondary | ICD-10-CM

## 2019-11-13 MED ORDER — CEPHALEXIN 500 MG PO CAPS: 500.0000 mg | ORAL_CAPSULE | Freq: Two times a day (BID) | ORAL | 0 refills | Status: AC

## 2019-11-13 NOTE — Progress Notes (Signed)
I have spent 5 minutes in review of e-visit questionnaire, review and updating patient chart, medical decision making and response to patient.   Jacora Hopkins Cody Dejohn Ibarra, PA-C    

## 2019-11-13 NOTE — Progress Notes (Signed)

## 2019-11-15 ENCOUNTER — Ambulatory Visit: Payer: BC Managed Care – PPO | Admitting: Family Medicine

## 2019-12-29 ENCOUNTER — Ambulatory Visit: Payer: BC Managed Care – PPO | Attending: Internal Medicine

## 2019-12-29 DIAGNOSIS — Z20822 Contact with and (suspected) exposure to covid-19: Secondary | ICD-10-CM

## 2019-12-29 DIAGNOSIS — Z20828 Contact with and (suspected) exposure to other viral communicable diseases: Secondary | ICD-10-CM | POA: Diagnosis not present

## 2019-12-31 LAB — NOVEL CORONAVIRUS, NAA: SARS-CoV-2, NAA: NOT DETECTED

## 2020-01-16 ENCOUNTER — Other Ambulatory Visit: Payer: Self-pay

## 2020-01-16 ENCOUNTER — Encounter: Payer: Self-pay | Admitting: Family Medicine

## 2020-01-16 ENCOUNTER — Ambulatory Visit (INDEPENDENT_AMBULATORY_CARE_PROVIDER_SITE_OTHER): Payer: BC Managed Care – PPO | Admitting: Family Medicine

## 2020-01-16 VITALS — BP 122/80 | HR 78 | Temp 97.4°F | Ht 63.0 in | Wt 130.0 lb

## 2020-01-16 DIAGNOSIS — M546 Pain in thoracic spine: Secondary | ICD-10-CM | POA: Diagnosis not present

## 2020-01-16 DIAGNOSIS — S46819A Strain of other muscles, fascia and tendons at shoulder and upper arm level, unspecified arm, initial encounter: Secondary | ICD-10-CM

## 2020-01-16 MED ORDER — CYCLOBENZAPRINE HCL 10 MG PO TABS: 5.0000 mg | ORAL_TABLET | Freq: Three times a day (TID) | ORAL | 0 refills | Status: DC | PRN

## 2020-01-16 MED ORDER — MELOXICAM 15 MG PO TABS: 15.0000 mg | ORAL_TABLET | Freq: Every day | ORAL | 0 refills | Status: DC

## 2020-01-16 NOTE — Patient Instructions (Signed)
Heat (pad or rice pillow in microwave) over affected area, 10-15 minutes twice daily.   Ice/cold pack over area for 10-15 min twice daily.  OK to take Tylenol 1000 mg (2 extra strength tabs) or 975 mg (3 regular strength tabs) every 6 hours as needed.  Take Flexeril (cyclobenzaprine) 1-2 hours before planned bedtime. If it makes you drowsy, do not take during the day. You can try half a tab the following night.  Don't get pregnant on these medications.  Mid-Back Strain Rehab It is normal to feel mild stretching, pulling, tightness, or discomfort as you do these exercises, but you should stop right away if you feel sudden pain or your pain gets worse.  Stretching and range of motion exercises This exercise warms up your muscles and joints and improves the movement and flexibility of your back and shoulders. This exercise also help to relieve pain. Exercise A: Chest and spine stretch  1. Lie down on your back on a firm surface. 2. Roll a towel or a small blanket so it is about 4 inches (10 cm) in diameter. 3. Put the towel lengthwise under the middle of your back so it is under your spine, but not under your shoulder blades. 4. To increase the stretch, you may put your hands behind your head and let your elbows fall to your sides. 5. Hold for 30 seconds. Repeat exercise 2 times. Complete this exercise 3 times per week. Strengthening exercises These exercises build strength and endurance in your back and your shoulder blade muscles. Endurance is the ability to use your muscles for a long time, even after they get tired. Exercise C: Straight arm rows (shoulder extension) 1. Stand with your feet shoulder width apart. 2. Secure an exercise band to a stable object in front of you so the band is at or above shoulder height. 3. Hold one end of the exercise band in each hand. 4. Straighten your elbows and lift your hands up to shoulder height. 5. Step back, away from the secured end of the  exercise band, until the band stretches. 6. Squeeze your shoulder blades together and pull your hands down to the sides of your thighs. Stop when your hands are straight down by your sides. Do not let your hands go behind your body. 7. Hold for 2 seconds. 8. Slowly return to the starting position. Repeat 2 times. Complete this exercise 3 times per week. Exercise D: Shoulder external rotation, prone 1. Lie on your abdomen on a firm bed so your left / right forearm hangs over the edge of the bed and your upper arm is on the bed, straight out from your body. ? Your elbow should be bent. ? Your palm should be facing your feet. 2. If instructed, hold a 2-5 lb weight in your hand. 3. Squeeze your shoulder blade toward the middle of your back. Do not let your shoulder lift toward your ear. 4. Keep your elbow bent in an "L" shape (90 degrees) while you slowly move your forearm up toward the ceiling. Move your forearm up to the height of the bed, toward your head. ? Your upper arm should not move. ? At the top of the movement, your palm should face the floor. 5. Hold for 1 second. 6. Slowly return to the starting position and relax your muscles. Repeat 2 times. Complete this exercise 3 times per week. Exercise E: Scapular retraction and external rotation, rowing  1. Sit in a stable chair without armrests, or  stand. 2. Secure an exercise band to a stable object in front of you so it is at shoulder height. 3. Hold one end of the exercise band in each hand. 4. Bring your arms out straight in front of you. 5. Step back, away from the secured end of the exercise band, until the band stretches. 6. Pull the band backward. As you do this, bend your elbows and squeeze your shoulder blades together, but avoid letting the rest of your body move. Do not let your shoulders lift up toward your ears. 7. Stop when your elbows are at your sides or slightly behind your body. 8. Hold for 1 second1. 9. Slowly  straighten your arms to return to the starting position. Repeat 2 times. Complete this exercise 3 times per week. Posture and body mechanics  Body mechanics refers to the movements and positions of your body while you do your daily activities. Posture is part of body mechanics. Good posture and healthy body mechanics can help to relieve stress in your body's tissues and joints. Good posture means that your spine is in its natural S-curve position (your spine is neutral), your shoulders are pulled back slightly, and your head is not tipped forward. The following are general guidelines for applying improved posture and body mechanics to your everyday activities. Standing   When standing, keep your spine neutral and your feet about hip-width apart. Keep a slight bend in your knees. Your ears, shoulders, and hips should line up.  When you do a task in which you lean forward while standing in one place for a long time, place one foot up on a stable object that is 2-4 inches (5-10 cm) high, such as a footstool. This helps keep your spine neutral. Sitting   When sitting, keep your spine neutral and keep your feet flat on the floor. Use a footrest, if necessary, and keep your thighs parallel to the floor. Avoid rounding your shoulders, and avoid tilting your head forward.  When working at a desk or a computer, keep your desk at a height where your hands are slightly lower than your elbows. Slide your chair under your desk so you are close enough to maintain good posture.  When working at a computer, place your monitor at a height where you are looking straight ahead and you do not have to tilt your head forward or downward to look at the screen. Resting  When lying down and resting, avoid positions that are most painful for you.  If you have pain with activities such as sitting, bending, stooping, or squatting (flexion-based activities), lie in a position in which your body does not bend very much.  For example, avoid curling up on your side with your arms and knees near your chest (fetal position).  If you have pain with activities such as standing for a long time or reaching with your arms (extension-based activities), lie with your spine in a neutral position and bend your knees slightly. Try the following positions:  Lying on your side with a pillow between your knees.  Lying on your back with a pillow under your knees.  Lifting   When lifting objects, keep your feet at least shoulder-width apart and tighten your abdominal muscles.  Bend your knees and hips and keep your spine neutral. It is important to lift using the strength of your legs, not your back. Do not lock your knees straight out.  Always ask for help to lift heavy or awkward objects. Make  sure you discuss any questions you have with your health care provider. Document Released: 12/15/2005 Document Revised: 08/21/2016 Document Reviewed: 09/26/2015 Elsevier Interactive Patient Education  2018 [employer]  Trapezius stretches/exercises Do exercises exactly as told by your health care provider and adjust them as directed. It is normal to feel mild stretching, pulling, tightness, or discomfort as you do these exercises, but you should stop right away if you feel sudden pain or your pain gets worse.  Stretching and range of motion exercises These exercises warm up your muscles and joints and improve the movement and flexibility of your shoulder. These exercises can also help to relieve pain, numbness, and tingling. If you are unable to do any of the following for any reason, do not further attempt to do it.   Exercise A: Flexion, standing    1. Stand and hold a broomstick, a cane, or a similar object. Place your hands a little more than shoulder-width apart on the object. Your left / right hand should be palm-up, and your other hand should be palm-down. 2. Push the stick to raise your left / right arm out to your  side and then over your head. Use your other hand to help move the stick. Stop when you feel a stretch in your shoulder, or when you reach the angle that is recommended by your health care provider. ? Avoid shrugging your shoulder while you raise your arm. Keep your shoulder blade tucked down toward your spine. 3. Hold for 30 seconds. 4. Slowly return to the starting position. Repeat 2 times. Complete this exercise 3 times per week.  Exercise B: Abduction, supine    1. Lie on your back and hold a broomstick, a cane, or a similar object. Place your hands a little more than shoulder-width apart on the object. Your left / right hand should be palm-up, and your other hand should be palm-down. 2. Push the stick to raise your left / right arm out to your side and then over your head. Use your other hand to help move the stick. Stop when you feel a stretch in your shoulder, or when you reach the angle that is recommended by your health care provider. ? Avoid shrugging your shoulder while you raise your arm. Keep your shoulder blade tucked down toward your spine. 3. Hold for 30 seconds. 4. Slowly return to the starting position. Repeat 2 times. Complete this exercise 3 times per week.  Exercise C: Flexion, active-assisted    1. Lie on your back. You may bend your knees for comfort. 2. Hold a broomstick, a cane, or a similar object. Place your hands about shoulder-width apart on the object. Your palms should face toward your feet. 3. Raise the stick and move your arms over your head and behind your head, toward the floor. Use your healthy arm to help your left / right arm move farther. Stop when you feel a gentle stretch in your shoulder, or when you reach the angle where your health care provider tells you to stop. 4. Hold for 30 seconds. 5. Slowly return to the starting position. Repeat 2 times. Complete this exercise 3 times per week.  Exercise D: External rotation and abduction    1. Stand  in a door frame with one of your feet slightly in front of the other. This is called a staggered stance. 2. Choose one of the following positions as told by your health care provider: ? Place your hands and forearms on the door  frame above your head. ? Place your hands and forearms on the door frame at the height of your head. ? Place your hands on the door frame at the height of your elbows. 3. Slowly move your weight onto your front foot until you feel a stretch across your chest and in the front of your shoulders. Keep your head and chest upright and keep your abdominal muscles tight. 4. Hold for 30 seconds. 5. To release the stretch, shift your weight to your back foot. Repeat 2 times. Complete this stretch 3 times per week.  Strengthening exercises These exercises build strength and endurance in your shoulder. Endurance is the ability to use your muscles for a long time, even after your muscles get tired. Exercise E: Scapular depression and adduction  1. Sit on a stable chair. Support your arms in front of you with pillows, armrests, or a tabletop. Keep your elbows in line with the sides of your body. 2. Gently move your shoulder blades down toward your middle back. Relax the muscles on the tops of your shoulders and in the back of your neck. 3. Hold for 3 seconds. 4. Slowly release the tension and relax your muscles completely before doing this exercise again. Repeat for a total of 10 repetitions. 5. After you have practiced this exercise, try doing the exercise without the arm support. Then, try the exercise while standing instead of sitting. Repeat 2 times. Complete this exercise 3 times per week.  Exercise F: Shoulder abduction, isometric    1. Stand or sit about 4-6 inches (10-15 cm) from a wall with your left / right side facing the wall. 2. Bend your left / right elbow and gently press your elbow against the wall. 3. Increase the pressure slowly until you are pressing as hard as  you can without shrugging your shoulder. 4. Hold for 3 seconds. 5. Slowly release the tension and relax your muscles completely. Repeat for a total of 10 repetitions. Repeat 2 times. Complete this exercise 3 times per week.  Exercise G: Shoulder flexion, isometric    1. Stand or sit about 4-6 inches (10-15 cm) away from a wall with your left / right side facing the wall. 2. Keep your left / right elbow straight and gently press the top of your fist against the wall. Increase the pressure slowly until you are pressing as hard as you can without shrugging your shoulder. 3. Hold for 10-15 seconds. 4. Slowly release the tension and relax your muscles completely. Repeat for a total of 10 repetitions. Repeat 2 times. Complete this exercise 3 times per week.  Exercise H: Internal rotation    1. Sit in a stable chair without armrests, or stand. Secure an exercise band at your left / right side, at elbow height. 2. Place a soft object, such as a folded towel or a small pillow, under your left / right upper arm so your elbow is a few inches (about 8 cm) away from your side. 3. Hold the end of the exercise band so the band stretches. 4. Keeping your elbow pressed against the soft object under your arm, move your forearm across your body toward your abdomen. Keep your body steady so the movement is only coming from your shoulder. 5. Hold for 3 seconds. 6. Slowly return to the starting position. Repeat for a total of 10 repetitions. Repeat 2 times. Complete this exercise 3 times per week.  Exercise I: External rotation    1. Sit in a  stable chair without armrests, or stand. 2. Secure an exercise band at your left / right side, at elbow height. 3. Place a soft object, such as a folded towel or a small pillow, under your left / right upper arm so your elbow is a few inches (about 8 cm) away from your side. 4. Hold the end of the exercise band so the band stretches. 5. Keeping your elbow pressed  against the soft object under your arm, move your forearm out, away from your abdomen. Keep your body steady so the movement is only coming from your shoulder. 6. Hold for 3 seconds. 7. Slowly return to the starting position. Repeat for a total of 10 repetitions. Repeat 2 times. Complete this exercise 3 times per week. Exercise J: Shoulder extension  1. Sit in a stable chair without armrests, or stand. Secure an exercise band to a stable object in front of you so the band is at shoulder height. 2. Hold one end of the exercise band in each hand. Your palms should face each other. 3. Straighten your elbows and lift your hands up to shoulder height. 4. Step back, away from the secured end of the exercise band, until the band stretches. 5. Squeeze your shoulder blades together and pull your hands down to the sides of your thighs. Stop when your hands are straight down by your sides. Do not let your hands go behind your body. 6. Hold for 3 seconds. 7. Slowly return to the starting position. Repeat for a total of 10 repetitions. Repeat 2 times. Complete this exercise 3 times per week.  Exercise K: Shoulder extension, prone    1. Lie on your abdomen on a firm surface so your left / right arm hangs over the edge. 2. Hold a 5 lb weight in your hand so your palm faces in toward your body. Your arm should be straight. 3. Squeeze your shoulder blade down toward the middle of your back. 4. Slowly raise your arm behind you, up to the height of the surface that you are lying on. Keep your arm straight. 5. Hold for 3 seconds. 6. Slowly return to the starting position and relax your muscles. Repeat for a total of 10 repetitions. Repeat 2 times. Complete this exercise 3 times per week.   Exercise L: Horizontal abduction, prone  1. Lie on your abdomen on a firm surface so your left / right arm hangs over the edge. 2. Hold a 5 lb weight in your hand so your palm faces toward your feet. Your arm should be  straight. 3. Squeeze your shoulder blade down toward the middle of your back. 4. Bend your elbow so your hand moves up, until your elbow is bent to an "L" shape (90 degrees). With your elbow bent, slowly move your forearm forward and up. Raise your hand up to the height of the surface that you are lying on. ? Your upper arm should not move, and your elbow should stay bent. ? At the top of the movement, your palm should face the floor. 5. Hold for 3 seconds. 6. Slowly return to the starting position and relax your muscles. Repeat for a total of 10 repetitions. Repeat 2 times. Complete this exercise 3 times per week.  Exercise M: Horizontal abduction, standing  1. Sit on a stable chair, or stand. 2. Secure an exercise band to a stable object in front of you so the band is at shoulder height. 3. Hold one end of the exercise  band in each hand. 4. Straighten your elbows and lift your hands straight in front of you, up to shoulder height. Your palms should face down, toward the floor. 5. Step back, away from the secured end of the exercise band, until the band stretches. 6. Move your arms out to your sides, and keep your arms straight. 7. Hold for 3 seconds. 8. Slowly return to the starting position. Repeat for a total of 10 repetitions. Repeat 2 times. Complete this exercise 3 times per week.  Exercise N: Scapular retraction and elevation  1. Sit on a stable chair, or stand. 2. Secure an exercise band to a stable object in front of you so the band is at shoulder height. 3. Hold one end of the exercise band in each hand. Your palms should face each other. 4. Sit in a stable chair without armrests, or stand. 5. Step back, away from the secured end of the exercise band, until the band stretches. 6. Squeeze your shoulder blades together and lift your hands over your head. Keep your elbows straight. 7. Hold for 3 seconds. 8. Slowly return to the starting position. Repeat for a total of 10  repetitions. Repeat 2 times. Complete this exercise 3 times per week.  This information is not intended to replace advice given to you by your health care provider. Make sure you discuss any questions you have with your health care provider. Document Released: 12/15/2005 Document Revised: 08/21/2016 Document Reviewed: 11/01/2015 Elsevier Interactive Patient Education  2017 [employer].

## 2020-01-16 NOTE — Progress Notes (Signed)
Musculoskeletal Exam  Patient: Susan Galloway DOB: 03-10-2001  DOS: 01/16/2020  SUBJECTIVE:  Chief Complaint:   Chief Complaint  Patient presents with  . Fall    Susan Galloway is a 19 y.o.  female for evaluation and treatment of back pain.   Onset:  2 days ago. Fell down 4 carpeted stairs. Did not hit head or have LOC.   Location: mid back Character:  sore, burning in certain positions  Progression of issue:  is unchanged Associated symptoms: no redness, bruising or swelling; limited ROM w flexion/extension Treatment: to date has been heat, stretching, BioFreeze.   Neurovascular symptoms: no  ROS: Musculoskeletal/Extremities: +back pain Heme: No bruising Neuro: No weakness  Past Medical History:  Diagnosis Date  . Constipation   . Environmental allergies   . Seasonal allergies     Objective: VITAL SIGNS: BP 122/80 (BP Location: Left Arm, Patient Position: Sitting, Cuff Size: Normal)   Pulse 78   Temp (!) 97.4 F (36.3 C) (Temporal)   Ht 5\' 3"  (1.6 m)   Wt 130 lb (59 kg)   SpO2 94%   BMI 23.03 kg/m  Constitutional: Well formed, well developed. No acute distress. Thorax & Lungs: No accessory muscle use Musculoskeletal: mid back.   Tenderness to palpation: yes over thor parasp msc and traps b/l; also ttp over SP's in mid thor region Deformity: no Ecchymosis: no Neurologic: Normal sensory function. No focal deficits noted. DTR's equal and symmetric in UE's. No clonus. Psychiatric: Normal mood. Age appropriate judgment and insight. Alert & oriented x 3.    Assessment:  Acute bilateral thoracic back pain - Plan: meloxicam (MOBIC) 15 MG tablet  Strain of trapezius muscle, unspecified laterality, initial encounter - Plan: cyclobenzaprine (FLEXERIL) 10 MG tablet, meloxicam (MOBIC) 15 MG tablet  Plan: Heat, ice, Tylenol, stretches/exercises, orders as above. She is not preg or trying to get pregnant. Warnings about Flexeril given.  F/u prn. The patient voiced  understanding and agreement to the plan.   [place], DO 01/16/20  11:08 AM

## 2020-01-18 ENCOUNTER — Ambulatory Visit: Payer: BC Managed Care – PPO | Admitting: Family Medicine

## 2020-03-08 ENCOUNTER — Other Ambulatory Visit: Payer: Self-pay

## 2020-03-09 ENCOUNTER — Ambulatory Visit (INDEPENDENT_AMBULATORY_CARE_PROVIDER_SITE_OTHER): Payer: BC Managed Care – PPO | Admitting: Family Medicine

## 2020-03-09 ENCOUNTER — Encounter: Payer: Self-pay | Admitting: Family Medicine

## 2020-03-09 DIAGNOSIS — M549 Dorsalgia, unspecified: Secondary | ICD-10-CM

## 2020-03-09 MED ORDER — MELOXICAM 15 MG PO TABS: 15.0000 mg | ORAL_TABLET | Freq: Every day | ORAL | 0 refills | Status: DC

## 2020-03-09 MED ORDER — CYCLOBENZAPRINE HCL 10 MG PO TABS: 5.0000 mg | ORAL_TABLET | Freq: Three times a day (TID) | ORAL | 0 refills | Status: DC | PRN
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
## 2020-03-12 ENCOUNTER — Ambulatory Visit: Payer: BC Managed Care – PPO | Admitting: Family Medicine

## 2020-03-16 ENCOUNTER — Ambulatory Visit: Payer: BC Managed Care – PPO | Attending: Internal Medicine

## 2020-03-16 DIAGNOSIS — Z23 Encounter for immunization: Secondary | ICD-10-CM

## 2020-03-16 NOTE — Progress Notes (Signed)
   Covid-19 Vaccination Clinic  Name:  Susan Galloway    MRN: [id number] DOB: 2001/12/17  03/16/2020  Susan Galloway was observed post Covid-19 immunization for 15 minutes without incident. She was provided with Vaccine Information Sheet and instruction to access the V-Safe system.   Susan Galloway was instructed to call 911 with any severe reactions post vaccine: Marland Kitchen Difficulty breathing  . Swelling of face and throat  . A fast heartbeat  . A bad rash all over body  . Dizziness and weakness   Immunizations Administered    Name Date Dose VIS Date Route   Pfizer COVID-19 Vaccine 03/16/2020 11:00 AM 0.3 mL 12/09/2019 Intramuscular   Manufacturer: [employer], [employer]   Lot: [id number]   NDC: [address]-9

## 2020-03-23 ENCOUNTER — Ambulatory Visit: Payer: BC Managed Care – PPO | Admitting: Family Medicine

## 2020-03-23 DIAGNOSIS — Z0289 Encounter for other administrative examinations: Secondary | ICD-10-CM

## 2020-04-01 ENCOUNTER — Other Ambulatory Visit: Payer: Self-pay | Admitting: Family Medicine

## 2020-04-01 DIAGNOSIS — M549 Dorsalgia, unspecified: Secondary | ICD-10-CM

## 2020-04-04 ENCOUNTER — Ambulatory Visit: Payer: BC Managed Care – PPO | Admitting: Family Medicine

## 2020-04-06 ENCOUNTER — Encounter: Payer: Self-pay | Admitting: Family Medicine

## 2020-04-06 ENCOUNTER — Ambulatory Visit (INDEPENDENT_AMBULATORY_CARE_PROVIDER_SITE_OTHER): Payer: BC Managed Care – PPO | Admitting: Family Medicine

## 2020-04-06 ENCOUNTER — Other Ambulatory Visit: Payer: Self-pay

## 2020-04-06 VITALS — BP 109/70 | HR 72 | Temp 98.2°F | Resp 16 | Ht 64.0 in | Wt 131.0 lb

## 2020-04-06 DIAGNOSIS — Z8349 Family history of other endocrine, nutritional and metabolic diseases: Secondary | ICD-10-CM | POA: Diagnosis not present

## 2020-04-06 DIAGNOSIS — R6889 Other general symptoms and signs: Secondary | ICD-10-CM

## 2020-04-06 LAB — IBC + FERRITIN
Ferritin: 3.2 ng/mL — ABNORMAL LOW (ref 10.0–291.0)
Iron: 46 ug/dL (ref 42–145)
Saturation Ratios: 8.8 % — ABNORMAL LOW (ref 20.0–50.0)
Transferrin: 373 mg/dL — ABNORMAL HIGH (ref 212.0–360.0)

## 2020-04-06 LAB — TSH: TSH: 1.25 u[IU]/mL (ref 0.35–5.50)

## 2020-04-06 LAB — CBC
HCT: 34.5 % — ABNORMAL LOW (ref 36.0–46.0)
Hemoglobin: 11.1 g/dL — ABNORMAL LOW (ref 12.0–15.0)
MCHC: 32.2 g/dL (ref 30.0–36.0)
MCV: 83.7 fl (ref 78.0–100.0)
Platelets: 326 10*3/uL (ref 150.0–400.0)
RBC: 4.12 Mil/uL (ref 3.87–5.11)
RDW: 14.6 % (ref 11.5–14.6)
WBC: 3.9 10*3/uL — ABNORMAL LOW (ref 4.5–10.5)

## 2020-04-06 LAB — T4, FREE: Free T4: 0.8 ng/dL (ref 0.60–1.60)

## 2020-04-06 NOTE — Progress Notes (Signed)
Chief Complaint  Patient presents with  . Follow-up    concerns about thyroid and anemia    Subjective: Patient is a 19 y.o. female here for a cold feeling.  Feels very cold over past 2 mo. Craves ice all the time. +famhx of hypothyroidism. No areas of easy bruising or bleeding.  Feels like she may have a double chin as well.  Cycles are heavy.  Does not take any iron supplementation.  Past Medical History:  Diagnosis Date  . Constipation   . Environmental allergies   . Seasonal allergies     Objective: BP 109/70 (BP Location: Right Arm, Patient Position: Sitting, Cuff Size: Small)   Pulse 72   Temp 98.2 F (36.8 C) (Temporal)   Resp 16   Ht 5\' 4"  (1.626 m)   Wt 131 lb (59.4 kg)   SpO2 99%   BMI 22.49 kg/m  General: Awake, appears stated age Neck: No thyromegaly, no nodules, no asymmetry Heart: RRR Lungs: CTAB, no rales, wheezes or rhonchi. No accessory muscle use Psych: Age appropriate judgment and insight, normal affect and mood  Assessment and Plan: Cold feeling - Plan: CBC, IBC + Ferritin, TSH, T4, free  Family history of thyroid disease  Check above labs.  She does not have a double chin. Follow-up pending the above. The patient voiced understanding and agreement to the plan.  [place], DO 04/06/20  12:02 PM

## 2020-04-06 NOTE — Patient Instructions (Signed)
Give [name] 2-3 business days to get the results of your labs back.   Your chin looks fine.   Let us know if you need anything.

## 2020-04-11 ENCOUNTER — Ambulatory Visit: Payer: BC Managed Care – PPO | Attending: Internal Medicine

## 2020-04-11 DIAGNOSIS — Z23 Encounter for immunization: Secondary | ICD-10-CM

## 2020-04-11 NOTE — Progress Notes (Signed)
   Covid-19 Vaccination Clinic  Name:  Susan Galloway    MRN: [id number] DOB: 03/07/2001  04/11/2020  [name] was observed post Covid-19 immunization for 15 minutes without incident. She was provided with Vaccine Information Sheet and instruction to access the V-Safe system.   [name] was instructed to call 911 with any severe reactions post vaccine: Marland Kitchen Difficulty breathing  . Swelling of face and throat  . A fast heartbeat  . A bad rash all over body  . Dizziness and weakness   Immunizations Administered    Name Date Dose VIS Date Route   Pfizer COVID-19 Vaccine 04/11/2020  9:12 AM 0.3 mL 12/09/2019 Intramuscular   Manufacturer: [employer], [employer]   Lot: [id number]   NDC: [address]-0

## 2020-04-16 ENCOUNTER — Ambulatory Visit: Payer: BC Managed Care – PPO | Attending: Internal Medicine

## 2020-04-16 ENCOUNTER — Other Ambulatory Visit: Payer: Self-pay

## 2020-04-16 DIAGNOSIS — Z20822 Contact with and (suspected) exposure to covid-19: Secondary | ICD-10-CM | POA: Diagnosis not present

## 2020-04-17 LAB — NOVEL CORONAVIRUS, NAA: SARS-CoV-2, NAA: NOT DETECTED

## 2020-04-17 LAB — SARS-COV-2, NAA 2 DAY TAT

## 2020-07-06 ENCOUNTER — Encounter: Payer: Self-pay | Admitting: Family Medicine

## 2020-07-06 ENCOUNTER — Other Ambulatory Visit: Payer: Self-pay

## 2020-07-06 ENCOUNTER — Ambulatory Visit (INDEPENDENT_AMBULATORY_CARE_PROVIDER_SITE_OTHER): Payer: BC Managed Care – PPO | Admitting: Family Medicine

## 2020-07-06 VITALS — BP 110/62 | HR 89 | Temp 98.3°F | Ht 64.0 in | Wt 130.2 lb

## 2020-07-06 DIAGNOSIS — Z0184 Encounter for antibody response examination: Secondary | ICD-10-CM | POA: Diagnosis not present

## 2020-07-06 DIAGNOSIS — Z23 Encounter for immunization: Secondary | ICD-10-CM | POA: Diagnosis not present

## 2020-07-06 NOTE — Progress Notes (Signed)
Chief Complaint  Patient presents with  . Follow-up    Subjective: Patient is a 19 y.o. female here for immunization status update.  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 She is utd w Hep B, Tdap, MMR. Unclear if she had varicella vaccine and she denies ever having the illness. She has had MenACWY, not MenB.    Past Medical History:  Diagnosis Date  . Constipation   . Environmental allergies   . Seasonal allergies     Objective: BP 110/62 (BP Location: Left Arm, Patient Position: Sitting, Cuff Size: Normal)   Pulse 89   Temp 98.3 F (36.8 C) (Oral)   Ht 5' 4"  (9.217 m)   Wt 130 lb 4 oz (59.1 kg)   SpO2 98%   BMI 22.36 kg/m  General: Awake, appears stated age Lungs: No accessory muscle use Psych: Age appropriate judgment and insight, normal affect and mood  Assessment and Plan: Immunity status testing - Plan: Varicella zoster antibody, IgG  Ck for immunity. MenB today. Other imms UTD.  F/u as originally scheduled.  The patient voiced understanding and agreement to the plan.  [place], DO 07/06/20  9:50 AM

## 2020-07-06 NOTE — Patient Instructions (Addendum)
Give me a week to get the results of this test back to you. We might need to re-vaccinate you, but I suspect an error.  Let us know if you need anything.

## 2020-07-09 LAB — VARICELLA ZOSTER ANTIBODY, IGG: Varicella IgG: 3247 index

## 2020-08-10 ENCOUNTER — Ambulatory Visit: Payer: BC Managed Care – PPO

## 2020-08-14 ENCOUNTER — Other Ambulatory Visit: Payer: Self-pay

## 2020-08-14 ENCOUNTER — Ambulatory Visit (INDEPENDENT_AMBULATORY_CARE_PROVIDER_SITE_OTHER): Payer: BC Managed Care – PPO

## 2020-08-14 DIAGNOSIS — Z23 Encounter for immunization: Secondary | ICD-10-CM

## 2020-08-14 NOTE — Progress Notes (Signed)
After obtaining consent, and per orders of Dr.Wendling , injection of  Bexsero given IM , Right Deltoid.   Patient tolerated injection well.

## 2020-09-07 DIAGNOSIS — Z20822 Contact with and (suspected) exposure to covid-19: Secondary | ICD-10-CM | POA: Diagnosis not present

## 2020-09-07 DIAGNOSIS — J029 Acute pharyngitis, unspecified: Secondary | ICD-10-CM | POA: Diagnosis not present

## 2020-09-07 DIAGNOSIS — R05 Cough: Secondary | ICD-10-CM | POA: Diagnosis not present

## 2020-09-07 DIAGNOSIS — R0981 Nasal congestion: Secondary | ICD-10-CM | POA: Diagnosis not present

## 2020-09-21 DIAGNOSIS — Z20822 Contact with and (suspected) exposure to covid-19: Secondary | ICD-10-CM | POA: Diagnosis not present

## 2020-09-21 DIAGNOSIS — J069 Acute upper respiratory infection, unspecified: Secondary | ICD-10-CM | POA: Diagnosis not present

## 2020-09-25 ENCOUNTER — Telehealth: Payer: BC Managed Care – PPO | Admitting: Family Medicine

## 2020-09-26 ENCOUNTER — Encounter: Payer: Self-pay | Admitting: Family Medicine

## 2020-09-26 ENCOUNTER — Telehealth (INDEPENDENT_AMBULATORY_CARE_PROVIDER_SITE_OTHER): Payer: BC Managed Care – PPO | Admitting: Family Medicine

## 2020-09-26 ENCOUNTER — Other Ambulatory Visit: Payer: Self-pay

## 2020-09-26 VITALS — BP 110/61 | HR 91 | Temp 97.0°F | Ht 64.0 in

## 2020-09-26 DIAGNOSIS — J40 Bronchitis, not specified as acute or chronic: Secondary | ICD-10-CM | POA: Insufficient documentation

## 2020-09-26 MED ORDER — PREDNISONE 10 MG PO TABS: ORAL_TABLET | ORAL | 0 refills | Status: DC

## 2020-09-26 MED ORDER — PROMETHAZINE-DM 6.25-15 MG/5ML PO SYRP: 5.0000 mL | ORAL_SOLUTION | Freq: Four times a day (QID) | ORAL | 0 refills | Status: DC | PRN

## 2020-09-26 MED ORDER — AZITHROMYCIN 250 MG PO TABS: ORAL_TABLET | ORAL | 0 refills | Status: DC

## 2020-09-26 NOTE — Progress Notes (Signed)
Virtual Visit via Video Note  I connected with Susan Galloway on 09/26/20 at  8:40 AM EDT by a video enabled telemedicine application and verified that I am speaking with the correct person using two identifiers.  Location /persons in visit Patient: home alone  Provider: home    I discussed the limitations of evaluation and management by telemedicine and the availability of in person appointments. The patient expressed understanding and agreed to proceed.  History of Present Illness: Pt is home c/o cough and congestion since last week she was taking otc and she did not get better --- she went to [facility] and was given cough meds.   She is continuing to get worse and now she is losing her voice    No fevers      Observations/Objective: Vitals:   09/26/20 0835  BP: 110/61  Pulse: 91  Temp: (!) 97 F (36.1 C)   Pt sounds congested ,  No NAD   Assessment and Plan: 1. Bronchitis Symptoms >1 week ---- coughing uncontrolled at times keeping her awake  - azithromycin (ZITHROMAX Z-PAK) 250 MG tablet; As directed  Dispense: 6 each; Refill: 0 - promethazine-dextromethorphan (PROMETHAZINE-DM) 6.25-15 MG/5ML syrup; Take 5 mLs by mouth 4 (four) times daily as needed.  Dispense: 118 mL; Refill: 0 - predniSONE (DELTASONE) 10 MG tablet; TAKE 3 TABLETS PO QD FOR 3 DAYS THEN TAKE 2 TABLETS PO QD FOR 3 DAYS THEN TAKE 1 TABLET PO QD FOR 3 DAYS THEN TAKE 1/2 TAB PO QD FOR 3 DAYS  Dispense: 20 tablet; Refill: 0  F/u next week if no better--- work note written for 2 days  Follow Up Instructions:    I discussed the assessment and treatment plan with the patient. The patient was provided an opportunity to ask questions and all were answered. The patient agreed with the plan and demonstrated an understanding of the instructions.   The patient was advised to call back or seek an in-person evaluation if the symptoms worsen or if the condition fails to improve as anticipated.  I provided 25 minutes of  non-face-to-face time during this encounter.   Donato Schultz, DO

## 2020-09-28 ENCOUNTER — Telehealth: Payer: Self-pay | Admitting: Family Medicine

## 2020-09-28 NOTE — Telephone Encounter (Signed)
Caller Susan Galloway  Call Back # [phone]  Patient seen by [facility] virtually, patient is requesting an extension  on her work note until Monday October 4,2021. Patient states she is still coughing.

## 2020-09-28 NOTE — Telephone Encounter (Signed)
Please advise 

## 2020-10-01 NOTE — Telephone Encounter (Signed)
Ok to give note 

## 2020-10-01 NOTE — Telephone Encounter (Signed)
Patient is checking the status of letter

## 2020-10-02 NOTE — Telephone Encounter (Signed)
Pt called. LVM. Letter sent to Mychart

## 2020-10-12 ENCOUNTER — Telehealth: Payer: Self-pay | Admitting: Family Medicine

## 2020-10-12 NOTE — Telephone Encounter (Signed)
Letter done. Spoke with patient. Letter placed at the front for patient pick up.

## 2020-10-12 NOTE — Telephone Encounter (Signed)
Pt came in office stating is needing a letter from provider mentioning  that she can return to work ASAP since pt's job is requesting a letter for pt to return to work, pt was seen virtually on 09-26-2020 with Dr Laury Axon. Pt would like to be called when letter is ready at [phone]. Please advise.

## 2020-10-12 NOTE — Telephone Encounter (Signed)
Only if symptoms have completely resolved

## 2020-12-10 ENCOUNTER — Encounter: Payer: BC Managed Care – PPO | Admitting: Family Medicine

## 2020-12-25 ENCOUNTER — Other Ambulatory Visit: Payer: Self-pay

## 2020-12-25 ENCOUNTER — Ambulatory Visit (INDEPENDENT_AMBULATORY_CARE_PROVIDER_SITE_OTHER): Payer: BC Managed Care – PPO | Admitting: Family Medicine

## 2020-12-25 ENCOUNTER — Encounter: Payer: Self-pay | Admitting: Family Medicine

## 2020-12-25 ENCOUNTER — Other Ambulatory Visit (HOSPITAL_COMMUNITY)
Admission: RE | Admit: 2020-12-25 | Discharge: 2020-12-25 | Disposition: A | Discharge status: Home / Self Care | Payer: BC Managed Care – PPO | Source: Ambulatory Visit | Attending: Family Medicine | Admitting: Family Medicine

## 2020-12-25 VITALS — BP 112/68 | HR 87 | Temp 98.3°F | Ht 64.0 in | Wt 125.5 lb

## 2020-12-25 DIAGNOSIS — Z111 Encounter for screening for respiratory tuberculosis: Secondary | ICD-10-CM

## 2020-12-25 DIAGNOSIS — Z1159 Encounter for screening for other viral diseases: Secondary | ICD-10-CM

## 2020-12-25 DIAGNOSIS — Z113 Encounter for screening for infections with a predominantly sexual mode of transmission: Secondary | ICD-10-CM

## 2020-12-25 DIAGNOSIS — Z0000 Encounter for general adult medical examination without abnormal findings: Secondary | ICD-10-CM

## 2020-12-25 LAB — COMPREHENSIVE METABOLIC PANEL
ALT: 10 U/L (ref 0–35)
AST: 13 U/L (ref 0–37)
Albumin: 4.3 g/dL (ref 3.5–5.2)
Alkaline Phosphatase: 32 U/L — ABNORMAL LOW (ref 39–117)
BUN: 15 mg/dL (ref 6–23)
CO2: 29 mEq/L (ref 19–32)
Calcium: 9.6 mg/dL (ref 8.4–10.5)
Chloride: 103 mEq/L (ref 96–112)
Creatinine, Ser: 0.8 mg/dL (ref 0.40–1.20)
GFR: 105.35 mL/min (ref >60.00)
Glucose, Bld: 93 mg/dL (ref 70–99)
Potassium: 4.4 mEq/L (ref 3.5–5.1)
Sodium: 136 mEq/L (ref 135–145)
Total Bilirubin: 1.1 mg/dL (ref 0.2–1.2)
Total Protein: 7.2 g/dL (ref 6.0–8.3)

## 2020-12-25 LAB — LIPID PANEL
Cholesterol: 153 mg/dL (ref 0–200)
HDL: 90.4 mg/dL (ref >39.00)
LDL Cholesterol: 51 mg/dL (ref 0–99)
NonHDL: 62.62
Total CHOL/HDL Ratio: 2
Triglycerides: 60 mg/dL (ref 0.0–149.0)
VLDL: 12 mg/dL (ref 0.0–40.0)

## 2020-12-25 NOTE — Patient Instructions (Addendum)
Call Center for [facility] at [facility] at [phone] for an appointment.  They are located at [address], [address], [place], [place], [address] (right across the hall from our office).  You do not need the varicella vaccination as we have laboratory proof that you are immune.  Give [name] 2-3 business days to get the results of your labs back.   Keep the diet clean and stay active.  Let us know if you need anything.

## 2020-12-25 NOTE — Progress Notes (Signed)
CC: Well visit  Well Woman Susan Galloway is here for a complete physical.   Her last physical was >1 year ago.  Current diet: in general, a "healthy" diet. Current exercise: dancing, wts and cardio. Contraception? No Fatigue out of ordinary? No Seatbelt? Yes   Health Maintenance Pap/HPV- No Tetanus- Yes  HIV screening- Yes Hep C screening- No  Past Medical History:  Diagnosis Date   Constipation    Environmental allergies    Seasonal allergies      Past Surgical History:  Procedure Laterality Date   Dental surgery     Medications  Takes no meds routinely.    Allergies Allergies  Allergen Reactions   Sulfa Antibiotics Hives    Review of Systems: Constitutional:  no unexpected weight changes Eye:  no recent significant change in vision Ear/Nose/Mouth/Throat:  Ears:  no tinnitus or vertigo and no recent change in hearing Nose/Mouth/Throat:  no complaints of nasal congestion, no sore throat Cardiovascular: no chest pain Respiratory:  no cough and no shortness of breath Gastrointestinal:  no abdominal pain, no change in bowel habits GU:  Female: negative for dysuria or pelvic pain Musculoskeletal/Extremities:  no pain of the joints Integumentary (Skin/Breast):  no abnormal skin lesions reported Neurologic:  no headaches Endocrine:  denies fatigue Hematologic/Lymphatic:  No areas of easy bleeding  Exam BP 112/68 (BP Location: Left Arm, Patient Position: Sitting, Cuff Size: Normal)    Pulse 87    Temp 98.3 F (36.8 C) (Oral)    Ht 5\' 4"  (1.626 m)    Wt 125 lb 8 oz (56.9 kg)    SpO2 96%    BMI 21.54 kg/m  General:  well developed, well nourished, in no apparent distress Skin:  no significant moles, warts, or growths Head:  no masses, lesions, or tenderness Eyes:  pupils equal and round, sclera anicteric without injection Ears:  canals without lesions, TMs shiny without retraction, no obvious effusion, no erythema Nose:  nares patent, septum midline, mucosa  normal, and no drainage or sinus tenderness Throat/Pharynx:  lips and gingiva without lesion; tongue and uvula midline; non-inflamed pharynx; no exudates or postnasal drainage Neck: neck supple without adenopathy, thyromegaly, or masses Lungs:  clear to auscultation, breath sounds equal bilaterally, no respiratory distress Cardio:  regular rate and rhythm, no bruits, no LE edema Abdomen:  abdomen soft, nontender; bowel sounds normal; no masses or organomegaly Genital: Defer to GYN Musculoskeletal:  symmetrical muscle groups noted without atrophy or deformity Extremities:  no clubbing, cyanosis, or edema, no deformities, no skin discoloration Neuro:  gait normal; deep tendon reflexes normal and symmetric Psych: well oriented with normal range of affect and appropriate judgment/insight  Assessment and Plan  Well adult exam - Plan: Lipid panel, Comprehensive metabolic panel  Screening-pulmonary TB - Plan: QuantiFERON-TB Gold Plus  Encounter for hepatitis C screening test for low risk patient - Plan: Hepatitis C antibody  Routine screening for STI (sexually transmitted infection) - Plan: Urine cytology ancillary only(Ducor)   Well 19 y.o. female. Counseled on diet and exercise. 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 Will obtain above labs. She is UTD w vaccinations. She has proof of immunity to varicella.  I did give her GYN info, but she is now living in [place], she will let [place] know if she needs anything.  The patient voiced understanding and agreement to the plan.  [name], DO 12/25/20 11:42 AM

## 2020-12-26 LAB — URINE CYTOLOGY ANCILLARY ONLY
Chlamydia: NEGATIVE
Comment: NEGATIVE
Comment: NORMAL
Neisseria Gonorrhea: NEGATIVE

## 2020-12-26 LAB — HEPATITIS C ANTIBODY
Hepatitis C Ab: NONREACTIVE
SIGNAL TO CUT-OFF: 0.09 (ref <1.00)

## 2020-12-27 LAB — QUANTIFERON-TB GOLD PLUS
Mitogen-NIL: >10 IU/mL
NIL: 0.02 IU/mL
QuantiFERON-TB Gold Plus: NEGATIVE
TB1-NIL: 0.01 IU/mL
TB2-NIL: 0.01 IU/mL

## 2021-01-15 ENCOUNTER — Telehealth: Payer: BC Managed Care – PPO | Admitting: Nurse Practitioner

## 2021-01-15 DIAGNOSIS — N3000 Acute cystitis without hematuria: Secondary | ICD-10-CM

## 2021-01-15 NOTE — Progress Notes (Signed)
Based on what you shared with me it looks like you have uti symptoms with back pain,that should be evaluated in a face to face office visit. Due to the associating back pain you will need a urinalysis and urine culture for proper treatment.    NOTE: If you entered your credit card information for this eVisit, you will not be charged. You may see a "hold" on your card for the $35 but that hold will drop off and you will not have a charge processed.  If you are having a true medical emergency please call 911.     For an urgent face to face visit, [facility] has four urgent care centers for your convenience:   . [facility]    [phone]                  Get Driving Directions  [address] [place], [place] [address] . 10 am to 8 pm Monday-Friday . 12 pm to 8 pm Saturday-Sunday   . [facility]  [phone]                  Get Driving Directions  [address] [place], [place] [address] . 8 am to 8 pm Monday-Friday . 9 am to 6 pm Saturday . 11 am to 6 pm Sunday   . [facility] [place]  [phone]                  Get Driving Directions   [address] [place], [place] [address] . 8 am to 8 pm Monday-Friday . 8 am to 4 pm Saturday-Sunday    .  [facility]gent [facility] at [facility]                    Get Driving Directions  [phone]  [address] [place], [place] [address]  . Monday-Friday, 12 PM to 6 PM    Your e-visit answers were reviewed by a board certified advanced clinical practitioner to complete your personal care plan.  Thank you for using e-Visits.  5-10 minutes spent reviewing and documenting in chart.  

## 2021-01-16 DIAGNOSIS — N3090 Cystitis, unspecified without hematuria: Secondary | ICD-10-CM | POA: Diagnosis not present

## 2021-02-13 ENCOUNTER — Encounter: Payer: Self-pay | Admitting: Obstetrics and Gynecology

## 2021-02-13 ENCOUNTER — Other Ambulatory Visit: Payer: Self-pay

## 2021-02-13 ENCOUNTER — Ambulatory Visit (INDEPENDENT_AMBULATORY_CARE_PROVIDER_SITE_OTHER): Payer: BC Managed Care – PPO | Admitting: Obstetrics and Gynecology

## 2021-02-13 ENCOUNTER — Other Ambulatory Visit (HOSPITAL_COMMUNITY)
Admission: RE | Admit: 2021-02-13 | Discharge: 2021-02-13 | Disposition: A | Discharge status: Home / Self Care | Payer: BC Managed Care – PPO | Source: Ambulatory Visit | Attending: Obstetrics and Gynecology | Admitting: Obstetrics and Gynecology

## 2021-02-13 VITALS — BP 122/79 | HR 96 | Ht 64.0 in | Wt 124.0 lb

## 2021-02-13 DIAGNOSIS — Z3009 Encounter for other general counseling and advice on contraception: Secondary | ICD-10-CM

## 2021-02-13 DIAGNOSIS — Z124 Encounter for screening for malignant neoplasm of cervix: Secondary | ICD-10-CM | POA: Insufficient documentation

## 2021-02-13 DIAGNOSIS — Z01419 Encounter for gynecological examination (general) (routine) without abnormal findings: Secondary | ICD-10-CM | POA: Diagnosis not present

## 2021-02-13 DIAGNOSIS — Z113 Encounter for screening for infections with a predominantly sexual mode of transmission: Secondary | ICD-10-CM | POA: Insufficient documentation

## 2021-02-13 MED ORDER — NORETHIN ACE-ETH ESTRAD-FE 1-20 MG-MCG(24) PO TABS: 1.0000 | ORAL_TABLET | Freq: Every day | ORAL | 3 refills | Status: DC

## 2021-02-13 NOTE — Progress Notes (Signed)
GYNECOLOGY ANNUAL PREVENTATIVE CARE ENCOUNTER NOTE  Subjective:   Susan Galloway is a 20 y.o. G0P0000 female here for a annual gynecologic exam. Current complaints: would like contraception, needs a pap smear. Periods are regular, lasting 7 days, not bad.    Denies abnormal vaginal bleeding, discharge, pelvic pain, problems with intercourse or other gynecologic concerns. Accepts STI screen.   Gynecologic History Patient's last menstrual period was 02/04/2021 (exact date). Contraception: condoms Last Pap: n/a Last mammogram: n/a Gardisil: has received  Obstetric History OB History  Gravida Para Term Preterm AB Living  0 0 0 0 0 0  SAB IAB Ectopic Multiple Live Births  0 0 0 0      Past Medical History:  Diagnosis Date  . Constipation   . Environmental allergies   . Seasonal allergies     Past Surgical History:  Procedure Laterality Date  . Dental surgery      No current outpatient medications on file prior to visit.   No current facility-administered medications on file prior to visit.    Allergies  Allergen Reactions  . Sulfa Antibiotics Hives    Social History   Socioeconomic History  . Marital status: Single    Spouse name: Not on file  . Number of children: 0  . Years of education: Not on file  . Highest education level: Not on file  Occupational History  . Occupation: Consulting civil engineer    Comment: Education officer, environmental at [employer]  . Smoking status: Never Smoker  . Smokeless tobacco: Never Used  Vaping Use  . Vaping Use: Never used  Substance and Sexual Activity  . Alcohol use: No    Alcohol/week: 0.0 standard drinks  . Drug use: No  . Sexual activity: Yes    Birth control/protection: Condom  Other Topics Concern  . Not on file  Social History Narrative  . Not on file   Social Determinants of Health   Financial Resource Strain: Not on file  Food Insecurity: Not on file  Transportation Needs: Not on file  Physical Activity: Not on file   Stress: Not on file  Social Connections: Not on file  Intimate Partner Violence: Not on file    Family History  Problem Relation Age of Onset  . Arthritis Mother        Living  . Diabetes Mother   . Hypertension Mother   . Hyperlipidemia Mother   . Thyroid disease Mother   . Hypothyroidism Mother   . Hypertension Father   . Hyperlipidemia Father        Living  . Kidney disease Sister   . Crohn's disease Brother   . Hyperlipidemia Brother        #2  . Hypertension Brother        #2   The following portions of the patient's history were reviewed and updated as appropriate: allergies, current medications, past family history, past medical history, past social history, past surgical history and problem list.  Review of Systems Pertinent items are noted in HPI.   Objective:  BP 122/79   Pulse 96   Ht 5\' 4"  (1.626 m)   Wt 124 lb (56.2 kg)   LMP 02/04/2021 (Exact Date)   BMI 21.28 kg/m  CONSTITUTIONAL: Well-developed, well-nourished female in no acute distress.  HENT:  Normocephalic, atraumatic, External right and left ear normal. Oropharynx is clear and moist EYES: Conjunctivae and EOM are normal. Pupils are equal, round, and reactive to light. No scleral icterus.  NECK: Normal range of motion, supple, no masses.  Normal thyroid.  SKIN: Skin is warm and dry. No rash noted. Not diaphoretic. No erythema. No pallor. NEUROLOGIC: Alert and oriented to person, place, and time. Normal reflexes, muscle tone coordination. No cranial nerve deficit noted. PSYCHIATRIC: Normal mood and affect. Normal behavior. Normal judgment and thought content. CARDIOVASCULAR: Normal heart rate noted RESPIRATORY: Effort normal, no problems with respiration noted. BREASTS: Symmetric in size. No masses, skin changes, nipple drainage, or lymphadenopathy. ABDOMEN: Soft, no distention noted.  No tenderness, rebound or guarding.  PELVIC: Normal appearing external genitalia; normal appearing vaginal mucosa  and cervix.  No abnormal discharge noted.  Pap smear obtained. Pelvic cultures obtained. Normal uterine size, no other palpable masses, no uterine or adnexal tenderness. MUSCULOSKELETAL: Normal range of motion. No tenderness.  No cyanosis, clubbing, or edema.  2+ distal pulses.  Exam done with chaperone present.  Assessment and Plan:   1. Well woman exam Healthy female exam  2. Routine screening for STI (sexually transmitted infection) - Cytology - PAP( [place])  3. Cervical cancer screening - Cytology - PAP( [place]) - HIV antibody (with reflex) - Hepatitis B Surface AntiGEN - [facility]  4. Encounter for counseling regarding contraception Reviewed options for birth control including oral contraceptive pills (combination and progesterone only), NuvaRing, Depo-Provera, Nexplanon, IUDs (copper and levonorgestrol). Thoroughly reviewed risks/benefits/side effects of each. Answered all questions. Patient with h/o of n/a and opts for OCPs. - reviewed risks/benefits, she is agreeable to starting OCPs    Will follow up results of pap smear/STI screen and manage accordingly. Encouraged improvement in diet and exercise.  Accepts STI screen. COVID vaccine UTD Mammogram n/a Referral for colonoscopy n/a Gardisil UTD  Routine preventative health maintenance measures emphasized. Please refer to After Visit Summary for other counseling recommendations.     Baldemar Lenis, MD, [facility] Attending Center for [facility])

## 2021-02-13 NOTE — Patient Instructions (Signed)
Use the following websites (and others) to help learn more about your contraception options and find the method that is right for you!  - The Centers for Disease Control (CDC) website: https://www.cdc.gov/reproductivehealth/contraception/index.htm  - [facility] website: https://www.plannedparenthood.org/learn/birth-control  - Bedsider.org: https://www.bedsider.org/methods  

## 2021-02-14 LAB — HEPATITIS B SURFACE ANTIGEN: Hepatitis B Surface Ag: NEGATIVE

## 2021-02-14 LAB — HIV ANTIBODY (ROUTINE TESTING W REFLEX): HIV Screen 4th Generation wRfx: NONREACTIVE

## 2021-02-14 LAB — RPR: RPR Ser Ql: NONREACTIVE

## 2021-02-19 LAB — CYTOLOGY - PAP
Chlamydia: NEGATIVE
Comment: NEGATIVE
Comment: NORMAL
Neisseria Gonorrhea: NEGATIVE

## 2021-05-01 DIAGNOSIS — N3000 Acute cystitis without hematuria: Secondary | ICD-10-CM | POA: Diagnosis not present

## 2021-05-01 DIAGNOSIS — R300 Dysuria: Secondary | ICD-10-CM | POA: Diagnosis not present

## 2021-05-03 ENCOUNTER — Ambulatory Visit: Payer: BC Managed Care – PPO | Admitting: Family Medicine

## 2021-05-31 ENCOUNTER — Encounter: Payer: Self-pay | Admitting: Family Medicine

## 2021-05-31 ENCOUNTER — Ambulatory Visit (INDEPENDENT_AMBULATORY_CARE_PROVIDER_SITE_OTHER): Payer: BC Managed Care – PPO | Admitting: Family Medicine

## 2021-05-31 ENCOUNTER — Other Ambulatory Visit: Payer: Self-pay

## 2021-05-31 VITALS — BP 112/82 | HR 83 | Temp 98.5°F | Ht 64.0 in | Wt 132.1 lb

## 2021-05-31 DIAGNOSIS — Z111 Encounter for screening for respiratory tuberculosis: Secondary | ICD-10-CM

## 2021-05-31 NOTE — Progress Notes (Signed)
Chief Complaint  Patient presents with  . Follow-up    Subjective: Patient is a 20 y.o. female here for Tb screen.  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 She was told she needs a more recent Tb test. She is not having any cough, night sweats, fevers or wt loss. She has not travelled to any Tb endemic regions recently. She has never had a positive Tb test.   Past Medical History:  Diagnosis Date  . Constipation   . Environmental allergies   . Seasonal allergies     Objective: BP 112/82 (BP Location: Left Arm, Patient Position: Sitting, Cuff Size: Normal)   Pulse 83   Temp 98.5 F (36.9 C) (Oral)   Ht 5\' 4"  (1.626 m)   Wt 132 lb 2 oz (59.9 kg)   SpO2 97%   BMI 22.68 kg/m  General: Awake, appears stated age Heart: RRR Lungs: CTAB, no rales, wheezes or rhonchi. No accessory muscle use Psych: Age appropriate judgment and insight, normal affect and mood  Assessment and Plan: Screening-pulmonary TB - Plan: QuantiFERON-TB Gold Plus  Orders as above. Will fill out any forms necessary. Imms are UTD. F/u prn.  The patient voiced understanding and agreement to the plan.  [place], DO 05/31/21  4:30 PM

## 2021-05-31 NOTE — Patient Instructions (Signed)
Happy to fill out any form. Feel free to have your parents drop off any form or you can send it via MyChart.   Give [name] 2-3 business days to get the results of your labs back.   Let us know if you need anything.

## 2021-06-02 LAB — QUANTIFERON-TB GOLD PLUS
Mitogen-NIL: >10 IU/mL
NIL: 0.03 IU/mL
QuantiFERON-TB Gold Plus: NEGATIVE
TB1-NIL: 0 IU/mL
TB2-NIL: 0.01 IU/mL

## 2021-06-04 NOTE — Telephone Encounter (Signed)
Form in wendling bin up front

## 2021-07-03 DIAGNOSIS — Z87440 Personal history of urinary (tract) infections: Secondary | ICD-10-CM | POA: Diagnosis not present

## 2021-07-03 DIAGNOSIS — R300 Dysuria: Secondary | ICD-10-CM | POA: Diagnosis not present

## 2021-07-03 DIAGNOSIS — N3000 Acute cystitis without hematuria: Secondary | ICD-10-CM | POA: Diagnosis not present

## 2021-07-03 DIAGNOSIS — R350 Frequency of micturition: Secondary | ICD-10-CM | POA: Diagnosis not present

## 2021-08-05 DIAGNOSIS — R300 Dysuria: Secondary | ICD-10-CM | POA: Diagnosis not present

## 2021-09-18 DIAGNOSIS — J069 Acute upper respiratory infection, unspecified: Secondary | ICD-10-CM | POA: Diagnosis not present

## 2021-09-26 ENCOUNTER — Encounter (HOSPITAL_COMMUNITY): Payer: Self-pay | Admitting: Emergency Medicine

## 2021-09-26 ENCOUNTER — Telehealth: Payer: Self-pay | Admitting: Family Medicine

## 2021-09-26 ENCOUNTER — Emergency Department (HOSPITAL_COMMUNITY)
Admission: EM | Admit: 2021-09-26 | Discharge: 2021-09-26 | Disposition: A | Discharge status: Home / Self Care | Payer: BC Managed Care – PPO | Attending: Emergency Medicine | Admitting: Emergency Medicine

## 2021-09-26 DIAGNOSIS — Z5321 Procedure and treatment not carried out due to patient leaving prior to being seen by health care provider: Secondary | ICD-10-CM | POA: Diagnosis not present

## 2021-09-26 DIAGNOSIS — K921 Melena: Secondary | ICD-10-CM | POA: Diagnosis not present

## 2021-09-26 DIAGNOSIS — R197 Diarrhea, unspecified: Secondary | ICD-10-CM | POA: Diagnosis not present

## 2021-09-26 DIAGNOSIS — R1084 Generalized abdominal pain: Secondary | ICD-10-CM | POA: Insufficient documentation

## 2021-09-26 DIAGNOSIS — R42 Dizziness and giddiness: Secondary | ICD-10-CM | POA: Diagnosis not present

## 2021-09-26 LAB — CBC WITH DIFFERENTIAL/PLATELET
Abs Immature Granulocytes: 0.02 10*3/uL (ref 0.00–0.07)
Basophils Absolute: 0.1 10*3/uL (ref 0.0–0.1)
Basophils Relative: 1 %
Eosinophils Absolute: 0 10*3/uL (ref 0.0–0.5)
Eosinophils Relative: 0 %
HCT: 40.8 % (ref 36.0–46.0)
Hemoglobin: 12.5 g/dL (ref 12.0–15.0)
Immature Granulocytes: 0 %
Lymphocytes Relative: 20 %
Lymphs Abs: 1.7 10*3/uL (ref 0.7–4.0)
MCH: 26 pg (ref 26.0–34.0)
MCHC: 30.6 g/dL (ref 30.0–36.0)
MCV: 84.8 fL (ref 80.0–100.0)
Monocytes Absolute: 0.3 10*3/uL (ref 0.1–1.0)
Monocytes Relative: 3 %
Neutro Abs: 6.3 10*3/uL (ref 1.7–7.7)
Neutrophils Relative %: 76 %
Platelets: 330 10*3/uL (ref 150–400)
RBC: 4.81 MIL/uL (ref 3.87–5.11)
RDW: 15.3 % (ref 11.5–15.5)
WBC: 8.4 10*3/uL (ref 4.0–10.5)
nRBC: 0 % (ref 0.0–0.2)

## 2021-09-26 LAB — COMPREHENSIVE METABOLIC PANEL
ALT: 27 U/L (ref 0–44)
AST: 24 U/L (ref 15–41)
Albumin: 4 g/dL (ref 3.5–5.0)
Alkaline Phosphatase: 32 U/L — ABNORMAL LOW (ref 38–126)
Anion gap: 10 (ref 5–15)
BUN: 10 mg/dL (ref 6–20)
CO2: 23 mmol/L (ref 22–32)
Calcium: 9.5 mg/dL (ref 8.9–10.3)
Chloride: 103 mmol/L (ref 98–111)
Creatinine, Ser: 0.74 mg/dL (ref 0.44–1.00)
GFR, Estimated: >60 mL/min (ref >60)
Glucose, Bld: 108 mg/dL — ABNORMAL HIGH (ref 70–99)
Potassium: 4.7 mmol/L (ref 3.5–5.1)
Sodium: 136 mmol/L (ref 135–145)
Total Bilirubin: 0.9 mg/dL (ref 0.3–1.2)
Total Protein: 7.6 g/dL (ref 6.5–8.1)

## 2021-09-26 LAB — URINALYSIS, ROUTINE W REFLEX MICROSCOPIC
Bilirubin Urine: NEGATIVE
Glucose, UA: NEGATIVE mg/dL
Hgb urine dipstick: NEGATIVE
Ketones, ur: NEGATIVE mg/dL
Nitrite: NEGATIVE
Protein, ur: NEGATIVE mg/dL
Specific Gravity, Urine: 1.011 (ref 1.005–1.030)
pH: 6 (ref 5.0–8.0)

## 2021-09-26 LAB — LIPASE, BLOOD: Lipase: 26 U/L (ref 11–51)

## 2021-09-26 NOTE — Telephone Encounter (Signed)
Patient mom Susan Galloway stated her daughter is having issues passing stools. When her daughter finally did she had blood within. Patient is also feeling weak. Advised patient no available appointments at this location today. Transferred to Triage.

## 2021-09-26 NOTE — Telephone Encounter (Signed)
Schedule her at 1130 please. Ty.

## 2021-09-26 NOTE — Telephone Encounter (Signed)
Nurse Assessment Nurse: Vaughan Browner, RN, Marchelle Folks Date/Time (Eastern Time): 09/26/2021 8:54:03 AM Confirm and document reason for call. If symptomatic, describe symptoms. ---caller states she got lightheaded/dizzy, felt like she was going to pass out. happens often. she will have diarrhea and most times she develops constipation after. this last time she had diarrhea 9x and then had a lot of blood. constant abd pain. is on tamiflu right now Does the patient have any new or worsening symptoms? ---Yes Will a triage be completed? ---Yes Related visit to physician within the last 2 weeks? ---Yes Does the PT have any chronic conditions? (i.e. diabetes, asthma, this includes High risk factors for pregnancy, etc.) ---Yes List chronic conditions. ---family hx of chron's Is the patient pregnant or possibly pregnant? (Ask all females between the ages of 53-55) ---No Is this a behavioral health or substance abuse call? ---No Guidelines Guideline Title Affirmed Question Affirmed Notes Nurse Date/Time (Eastern Time) Abdominal Pain - Female Blood in bowel movements (Exception: blood on [place], RN, Marchelle Folks 09/26/2021 8:57:14 AM PLEASE NOTE: All timestamps contained within this report are represented as [place] Standard Time. CONFIDENTIALTY NOTICE: This fax transmission is intended only for the addressee. It contains information that is legally privileged, confidential or otherwise protected from use or disclosure. If you are not the intended recipient, you are strictly prohibited from reviewing, disclosing, copying using or disseminating any of this information or taking any action in reliance on or regarding this information. If you have received this fax in error, please notify us immediately by telephone so that we can arrange for its return to [name]. Phone: [phone], Toll-Free: [phone], Fax: [phone] Page: 2 of 2 Call Id: [id number] Guidelines Guideline Title Affirmed Question  Affirmed Notes Nurse Date/Time Lamount Cohen Time) surface of BM with constipation) Disp. Time Lamount Cohen Time) Disposition Final User 09/26/2021 9:01:00 AM Go to ED Now Yes [place], RN, Earnestine Leys Disagree/Comply Comply Caller Understands Yes PreDisposition Go to ED Care Advice Given Per Guideline GO TO ED NOW: * You need to be seen in the Emergency Department. * Go to the ED at ___________ Hospital. * Leave now. Drive carefully. CARE ADVICE given per Abdominal Pain, Female (Adult) guideline. Referrals GO TO FACILITY OTHER - SPECIFY  Pt in ED.

## 2021-09-26 NOTE — ED Triage Notes (Signed)
Pt endorses generalized abd pain and diarrhea since 5 this morning. About 10 episodes of diarrhea and some bloody.

## 2021-09-26 NOTE — Telephone Encounter (Signed)
Called her mom and she said the patient is going to the ER. The GI RN with triage told her to go to the ER. She will followup with PCP after being seen at the hospital

## 2021-09-26 NOTE — ED Provider Notes (Signed)
Emergency Medicine Provider Triage Evaluation Note  Susan Galloway , a 20 y.o. female  was evaluated in triage.  Pt complains of some lightheadedness after straining on toilet followed by blood diarrhea this morning. Patient recently had the flu and was given tamaflu. Patient denies hx of anemia but does endorse getting cold frequently.  Review of Systems  Positive: GI upset, blood stool, lightheadedness Negative: N/V  Physical Exam  BP 135/87 (BP Location: Right Arm)   Pulse 85   Temp 97.9 F (36.6 C) (Oral)   Resp 14   Ht 5\' 4"  (1.626 m)   Wt 56.7 kg   LMP 09/15/2021   SpO2 100%   BMI 21.46 kg/m  Gen:   Awake, no distress   Resp:  Normal effort  MSK:   Moves extremities without difficulty  Other:  Generalized TTP of abdomen  Medical Decision Making  Medically screening exam initiated at 11:03 AM.  Appropriate orders placed.  Susan Galloway was informed that the remainder of the evaluation will be completed by another provider, this initial triage assessment does not replace that evaluation, and the importance of remaining in the ED until their evaluation is complete.  Bloody diarrhea, lightheadedness   Susan Galloway 09/26/21 1104    09/28/21, MD 09/26/21 1255

## 2021-09-26 NOTE — ED Notes (Signed)
Pt advised this NT that she was leaving couldn't stay any longer.

## 2021-09-27 ENCOUNTER — Other Ambulatory Visit: Payer: Self-pay

## 2021-09-27 ENCOUNTER — Ambulatory Visit (INDEPENDENT_AMBULATORY_CARE_PROVIDER_SITE_OTHER): Payer: BC Managed Care – PPO | Admitting: Family

## 2021-09-27 VITALS — BP 113/76 | HR 100 | Temp 98.7°F | Resp 16 | Ht 64.0 in | Wt 125.0 lb

## 2021-09-27 DIAGNOSIS — K921 Melena: Secondary | ICD-10-CM | POA: Insufficient documentation

## 2021-09-27 LAB — CBC WITH DIFFERENTIAL/PLATELET
Basophils Absolute: 0.1 10*3/uL (ref 0.0–0.1)
Basophils Relative: 1.4 % (ref 0.0–3.0)
Eosinophils Absolute: 0.1 10*3/uL (ref 0.0–0.7)
Eosinophils Relative: 2.3 % (ref 0.0–5.0)
HCT: 41.1 % (ref 36.0–46.0)
Hemoglobin: 13 g/dL (ref 12.0–15.0)
Lymphocytes Relative: 36.9 % (ref 12.0–46.0)
Lymphs Abs: 1.8 10*3/uL (ref 0.7–4.0)
MCHC: 31.6 g/dL (ref 30.0–36.0)
MCV: 82.1 fl (ref 78.0–100.0)
Monocytes Absolute: 0.3 10*3/uL (ref 0.1–1.0)
Monocytes Relative: 5.1 % (ref 3.0–12.0)
Neutro Abs: 2.7 10*3/uL (ref 1.4–7.7)
Neutrophils Relative %: 54.3 % (ref 43.0–77.0)
Platelets: 324 10*3/uL (ref 150.0–400.0)
RBC: 5.01 Mil/uL (ref 3.87–5.11)
RDW: 15.6 % — ABNORMAL HIGH (ref 11.5–15.5)
WBC: 5 10*3/uL (ref 4.0–10.5)

## 2021-09-27 LAB — IRON: Iron: 85 ug/dL (ref 42–145)

## 2021-09-27 LAB — FERRITIN: Ferritin: 7.6 ng/mL — ABNORMAL LOW (ref 10.0–291.0)

## 2021-09-27 NOTE — Progress Notes (Signed)
Subjective:     Patient ID: Susan Galloway, female    DOB: 2001/08/22, 20 y.o.   MRN: [id number]  Chief Complaint  Patient presents with   Abdominal Pain    Complains of abdominal pain    Diarrhea    Complains of diarrhea after feeling constipated blood in stool    Abdominal Pain Associated symptoms include diarrhea.  Diarrhea  Associated symptoms include abdominal pain.  Patient is in today with chief complaint of abdominal pain.  She is accompanied by her father today. Started 2 days ago.  She had 10 episodes of diarrhea.  1 loose stool yesterday.  She saw "globs of stool." No recent travel of fever.    Health Maintenance Due  Topic Date Due   INFLUENZA VACCINE  07/29/2021    Past Medical History:  Diagnosis Date   Constipation    Environmental allergies    Seasonal allergies     Past Surgical History:  Procedure Laterality Date   Dental surgery      Family History  Problem Relation Age of Onset   Arthritis Mother        Living   Diabetes Mother    Hypertension Mother    Hyperlipidemia Mother    Thyroid disease Mother    Hypothyroidism Mother    Hypertension Father    Hyperlipidemia Father        Living   Kidney disease Sister    Crohn's disease Brother    Hyperlipidemia Brother        #2   Hypertension Brother        #2    Social History   Socioeconomic History   Marital status: Single    Spouse name: Not on file   Number of children: 0   Years of education: Not on file   Highest education level: Not on file  Occupational History   Occupation: Consulting civil engineer    Comment: Education officer, environmental at [employer]  Tobacco Use   Smoking status: Never   Smokeless tobacco: Never  Vaping Use   Vaping Use: Never used  Substance and Sexual Activity   Alcohol use: No    Alcohol/week: 0.0 standard drinks   Drug use: No   Sexual activity: Yes    Birth control/protection: Condom  Other Topics Concern   Not on file  Social History Narrative   Not on file   Social  Determinants of Health   Financial Resource Strain: Not on file  Food Insecurity: Not on file  Transportation Needs: Not on file  Physical Activity: Not on file  Stress: Not on file  Social Connections: Not on file  Intimate Partner Violence: Not on file    Outpatient Medications Prior to Visit  Medication Sig Dispense Refill   Norethindrone Acetate-Ethinyl Estrad-FE (LOESTRIN 24 FE) 1-20 MG-MCG(24) tablet Take 1 tablet by mouth daily. 84 tablet 3   No facility-administered medications prior to visit.    Allergies  Allergen Reactions   Sulfa Antibiotics Hives    Review of Systems  Gastrointestinal:  Positive for abdominal pain and diarrhea.     See HPI Objective:    Physical Exam Constitutional:      Appearance: She is well-developed.  Cardiovascular:     Rate and Rhythm: Normal rate and regular rhythm.     Heart sounds: Normal heart sounds. No murmur heard. Pulmonary:     Effort: Pulmonary effort is normal. No respiratory distress.     Breath sounds: Normal breath sounds. No wheezing.  Abdominal:     General: Bowel sounds are decreased.     Tenderness: There is abdominal tenderness in the right lower quadrant and left lower quadrant. There is no guarding.  Psychiatric:        Behavior: Behavior normal.        Thought Content: Thought content normal.        Judgment: Judgment normal.    BP 113/76 (BP Location: Left Arm, Patient Position: Sitting, Cuff Size: Small)   Pulse 100   Temp 98.7 F (37.1 C) (Oral)   Resp 16   Ht 5\' 4"  (1.626 m)   Wt 125 lb (56.7 kg)   LMP 09/15/2021   SpO2 100%   BMI 21.46 kg/m  Wt Readings from Last 3 Encounters:  09/27/21 125 lb (56.7 kg)  09/26/21 125 lb (56.7 kg)  05/31/21 132 lb 2 oz (59.9 kg)       Assessment & Plan:   Problem List Items Addressed This Visit       Unprioritized   Bloody stool - Primary    New. 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  Refer to Dr. 07/31/21 at pt  request.  Discussed red flags that should prompt her to go to ED. Recommended prn imodium.  Brother has Crohn's disease which is concerning. She is at increased risk for crohn's.       Relevant Orders   CBC with Differential/Platelet   Iron   Ferritin   Ambulatory referral to Gastroenterology    I have discontinued Tanyiah Uriostegui's Norethindrone Acetate-Ethinyl Estrad-FE.  No orders of the defined types were placed in this encounter.

## 2021-09-27 NOTE — Patient Instructions (Addendum)
Please complete lab work prior to leaving.  You should be contacted about scheduling your appointment with Dr. Elnoria Howard.    Bloody Diarrhea Bloody diarrhea is frequent loose and watery bowel movements that contain blood. The blood can be hard to see or notice (occult). Bloody diarrhea may be caused by medical conditions such as: Ulcerative colitis. Crohn's disease. Intestinal infection. Viral gastroenteritis or bacterial gastroenteritis. Finding out why there is blood in your diarrhea is necessary so that your health care provider can prescribe the right treatment for you. Follow the instructions from your health care provider about treating the cause of your bloody diarrhea. Any type of diarrhea can make you feel weak and dehydrated. Dehydration can make you tired and thirsty, cause you to have a dry mouth, and decrease how often you urinate. Follow these instructions at home: Eating and drinking   Follow these recommendations as told by your health care provider: Take an oral rehydration solution (ORS). This is an over-the-counter medicine that helps return your body to its normal balance of nutrients and water. It is found at pharmacies and retail stores. Drink enough fluid to keep your urine pale yellow. Drink fluids such as water, ice chips, diluted fruit juice, and low-calorie sports drinks. You can also drink milk products, if desired. Avoid drinking fluids that contain a lot of sugar or caffeine, such as energy drinks, regular sports drinks, and soda. Avoid alcohol. Eat bland, easy-to-digest foods in small amounts as you are able. These foods include bananas, applesauce, rice, lean meats, toast, and crackers. Avoid spicy or fatty foods.  Medicines Take over-the-counter and prescription medicines only as told by your health care provider. Your health care provider may prescribe medicine to slow down the frequency of diarrhea or to ease stomach discomfort. If you were prescribed an  antibiotic medicine, take it as told by your health care provider. Do not stop using the antibiotic even if you start to feel better. General instructions  Wash your hands often using soap and water. If soap and water are not available, use a hand sanitizer. Others in the household should wash their hands as well. Hands should be washed: After using the toilet or changing a diaper. Before preparing, cooking, or serving food. While caring for a sick person or while visiting someone in a hospital. Rest at home while you recover. Take a warm bath to relieve any burning or pain from frequent diarrhea episodes. Watch your condition for any changes. Keep all follow-up visits as told by your health care provider. This is important. Contact a health care provider if: You have a fever. Your diarrhea gets worse. You have new symptoms. You cannot keep fluids down. You feel light-headed or dizzy. You have a headache. You have muscle cramps. Get help right away if: You have chest pain. You feel extremely weak or you faint. The blood in your diarrhea increases or turns a different color. You vomit and the vomit is bloody or looks black. You have persistent diarrhea. You have severe pain, cramping, or bloating in your abdomen. You have trouble breathing or you are breathing very quickly. Your heart is beating very quickly. Your skin feels cold and clammy. You feel confused. You have signs of dehydration, such as: Dark urine, very little urine, or no urine. Cracked lips. Dry mouth. Sunken eyes. Sleepiness. Weakness. Summary Bloody diarrhea is frequent loose and watery bowel movements that contain blood. The blood can be hard to see or notice (occult). Follow the instructions from your  health care provider about treating the cause of your bloody diarrhea. Any type of diarrhea can make you feel weak and dehydrated. Follow your health care provider's recommendations for eating and drinking and  for taking medicines. Contact your health care provider if your symptoms get worse. Get help right away if you have signs of dehydration. This information is not intended to replace advice given to you by your health care provider. Make sure you discuss any questions you have with your health care provider. Document Revised: 05/27/2018 Document Reviewed: 05/27/2018 Elsevier Patient Education  2022 [employer].

## 2021-09-27 NOTE — Assessment & Plan Note (Addendum)
New. 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  Refer to Dr. Elnoria Howard at pt request.  Discussed red flags that should prompt her to go to ED. Recommended prn imodium.  Brother has Crohn's disease which is concerning. She is at increased risk for crohn's.

## 2021-09-29 ENCOUNTER — Telehealth: Payer: Self-pay | Admitting: Family

## 2021-09-29 MED ORDER — MULTI-VITAMIN/MINERALS PO TABS: 1.0000 | ORAL_TABLET | Freq: Every day | ORAL | Status: DC

## 2021-09-29 NOTE — Telephone Encounter (Signed)
See mychart.  

## 2021-10-07 DIAGNOSIS — R1031 Right lower quadrant pain: Secondary | ICD-10-CM | POA: Diagnosis not present

## 2021-10-07 DIAGNOSIS — R195 Other fecal abnormalities: Secondary | ICD-10-CM | POA: Diagnosis not present

## 2021-10-07 DIAGNOSIS — R197 Diarrhea, unspecified: Secondary | ICD-10-CM | POA: Diagnosis not present

## 2021-10-17 DIAGNOSIS — R197 Diarrhea, unspecified: Secondary | ICD-10-CM | POA: Diagnosis not present

## 2021-10-17 DIAGNOSIS — K921 Melena: Secondary | ICD-10-CM | POA: Diagnosis not present

## 2021-10-17 DIAGNOSIS — R195 Other fecal abnormalities: Secondary | ICD-10-CM | POA: Diagnosis not present

## 2021-10-17 LAB — HM COLONOSCOPY

## 2021-11-18 DIAGNOSIS — K580 Irritable bowel syndrome with diarrhea: Secondary | ICD-10-CM | POA: Diagnosis not present

## 2021-12-06 ENCOUNTER — Encounter: Payer: Self-pay | Admitting: General Practice

## 2022-02-03 ENCOUNTER — Encounter: Payer: Self-pay | Admitting: Family Medicine

## 2022-02-03 ENCOUNTER — Ambulatory Visit (INDEPENDENT_AMBULATORY_CARE_PROVIDER_SITE_OTHER): Payer: BC Managed Care – PPO | Admitting: Family Medicine

## 2022-02-03 VITALS — BP 108/66 | HR 83 | Temp 98.4°F | Ht 64.0 in | Wt 134.4 lb

## 2022-02-03 DIAGNOSIS — S46819A Strain of other muscles, fascia and tendons at shoulder and upper arm level, unspecified arm, initial encounter: Secondary | ICD-10-CM | POA: Diagnosis not present

## 2022-02-03 MED ORDER — TIZANIDINE HCL 4 MG PO TABS: 4.0000 mg | ORAL_TABLET | Freq: Four times a day (QID) | ORAL | 0 refills | Status: DC | PRN

## 2022-02-03 NOTE — Patient Instructions (Signed)
Heat (pad or rice pillow in microwave) over affected area, 10-15 minutes twice daily.   Ice/cold pack over area for 10-15 min twice daily.  OK to take Tylenol 1000 mg (2 extra strength tabs) or 975 mg (3 regular strength tabs) every 6 hours as needed.  Ibuprofen 400-600 mg (2-3 over the counter strength tabs) every 6 hours as needed for pain.  Let us know if you need anything.  Trapezius stretches/exercises Do exercises exactly as told by your health care provider and adjust them as directed. It is normal to feel mild stretching, pulling, tightness, or discomfort as you do these exercises, but you should stop right away if you feel sudden pain or your pain gets worse.   Stretching and range of motion exercises These exercises warm up your muscles and joints and improve the movement and flexibility of your shoulder. These exercises can also help to relieve pain, numbness, and tingling. If you are unable to do any of the following for any reason, do not further attempt to do it.   Exercise A: Flexion, standing     Stand and hold a broomstick, a cane, or a similar object. Place your hands a little more than shoulder-width apart on the object. Your left / right hand should be palm-up, and your other hand should be palm-down. Push the stick to raise your left / right arm out to your side and then over your head. Use your other hand to help move the stick. Stop when you feel a stretch in your shoulder, or when you reach the angle that is recommended by your health care provider. Avoid shrugging your shoulder while you raise your arm. Keep your shoulder blade tucked down toward your spine. Hold for 30 seconds. Slowly return to the starting position. Repeat 2 times. Complete this exercise 3 times per week.  Exercise B: Abduction, supine     Lie on your back and hold a broomstick, a cane, or a similar object. Place your hands a little more than shoulder-width apart on the object. Your left /  right hand should be palm-up, and your other hand should be palm-down. Push the stick to raise your left / right arm out to your side and then over your head. Use your other hand to help move the stick. Stop when you feel a stretch in your shoulder, or when you reach the angle that is recommended by your health care provider. Avoid shrugging your shoulder while you raise your arm. Keep your shoulder blade tucked down toward your spine. Hold for 30 seconds. Slowly return to the starting position. Repeat 2 times. Complete this exercise 3 times per week.  Exercise C: Flexion, active-assisted     Lie on your back. You may bend your knees for comfort. Hold a broomstick, a cane, or a similar object. Place your hands about shoulder-width apart on the object. Your palms should face toward your feet. Raise the stick and move your arms over your head and behind your head, toward the floor. Use your healthy arm to help your left / right arm move farther. Stop when you feel a gentle stretch in your shoulder, or when you reach the angle where your health care provider tells you to stop. Hold for 30 seconds. Slowly return to the starting position. Repeat 2 times. Complete this exercise 3 times per week.  Exercise D: External rotation and abduction     Stand in a door frame with one of your feet slightly in front   of the other. This is called a staggered stance. Choose one of the following positions as told by your health care provider: Place your hands and forearms on the door frame above your head. Place your hands and forearms on the door frame at the height of your head. Place your hands on the door frame at the height of your elbows. Slowly move your weight onto your front foot until you feel a stretch across your chest and in the front of your shoulders. Keep your head and chest upright and keep your abdominal muscles tight. Hold for 30 seconds. To release the stretch, shift your weight to your back  foot. Repeat 2 times. Complete this stretch 3 times per week.  Strengthening exercises These exercises build strength and endurance in your shoulder. Endurance is the ability to use your muscles for a long time, even after your muscles get tired. Exercise E: Scapular depression and adduction  Sit on a stable chair. Support your arms in front of you with pillows, armrests, or a tabletop. Keep your elbows in line with the sides of your body. Gently move your shoulder blades down toward your middle back. Relax the muscles on the tops of your shoulders and in the back of your neck. Hold for 3 seconds. Slowly release the tension and relax your muscles completely before doing this exercise again. Repeat for a total of 10 repetitions. After you have practiced this exercise, try doing the exercise without the arm support. Then, try the exercise while standing instead of sitting. Repeat 2 times. Complete this exercise 3 times per week.  Exercise F: Shoulder abduction, isometric     Stand or sit about 4-6 inches (10-15 cm) from a wall with your left / right side facing the wall. Bend your left / right elbow and gently press your elbow against the wall. Increase the pressure slowly until you are pressing as hard as you can without shrugging your shoulder. Hold for 3 seconds. Slowly release the tension and relax your muscles completely. Repeat for a total of 10 repetitions. Repeat 2 times. Complete this exercise 3 times per week.  Exercise G: Shoulder flexion, isometric     Stand or sit about 4-6 inches (10-15 cm) away from a wall with your left / right side facing the wall. Keep your left / right elbow straight and gently press the top of your fist against the wall. Increase the pressure slowly until you are pressing as hard as you can without shrugging your shoulder. Hold for 10-15 seconds. Slowly release the tension and relax your muscles completely. Repeat for a total of 10 repetitions. Repeat  2 times. Complete this exercise 3 times per week.  Exercise H: Internal rotation     Sit in a stable chair without armrests, or stand. Secure an exercise band at your left / right side, at elbow height. Place a soft object, such as a folded towel or a small pillow, under your left / right upper arm so your elbow is a few inches (about 8 cm) away from your side. Hold the end of the exercise band so the band stretches. Keeping your elbow pressed against the soft object under your arm, move your forearm across your body toward your abdomen. Keep your body steady so the movement is only coming from your shoulder. Hold for 3 seconds. Slowly return to the starting position. Repeat for a total of 10 repetitions. Repeat 2 times. Complete this exercise 3 times per week.  Exercise I:   External rotation     Sit in a stable chair without armrests, or stand. Secure an exercise band at your left / right side, at elbow height. Place a soft object, such as a folded towel or a small pillow, under your left / right upper arm so your elbow is a few inches (about 8 cm) away from your side. Hold the end of the exercise band so the band stretches. Keeping your elbow pressed against the soft object under your arm, move your forearm out, away from your abdomen. Keep your body steady so the movement is only coming from your shoulder. Hold for 3 seconds. Slowly return to the starting position. Repeat for a total of 10 repetitions. Repeat 2 times. Complete this exercise 3 times per week. Exercise J: Shoulder extension  Sit in a stable chair without armrests, or stand. Secure an exercise band to a stable object in front of you so the band is at shoulder height. Hold one end of the exercise band in each hand. Your palms should face each other. Straighten your elbows and lift your hands up to shoulder height. Step back, away from the secured end of the exercise band, until the band stretches. Squeeze your shoulder  blades together and pull your hands down to the sides of your thighs. Stop when your hands are straight down by your sides. Do not let your hands go behind your body. Hold for 3 seconds. Slowly return to the starting position. Repeat for a total of 10 repetitions. Repeat 2 times. Complete this exercise 3 times per week.  Exercise K: Shoulder extension, prone     Lie on your abdomen on a firm surface so your left / right arm hangs over the edge. Hold a 5 lb weight in your hand so your palm faces in toward your body. Your arm should be straight. Squeeze your shoulder blade down toward the middle of your back. Slowly raise your arm behind you, up to the height of the surface that you are lying on. Keep your arm straight. Hold for 3 seconds. Slowly return to the starting position and relax your muscles. Repeat for a total of 10 repetitions. Repeat 2 times. Complete this exercise 3 times per week.   Exercise L: Horizontal abduction, prone  Lie on your abdomen on a firm surface so your left / right arm hangs over the edge. Hold a 5 lb weight in your hand so your palm faces toward your feet. Your arm should be straight. Squeeze your shoulder blade down toward the middle of your back. Bend your elbow so your hand moves up, until your elbow is bent to an "L" shape (90 degrees). With your elbow bent, slowly move your forearm forward and up. Raise your hand up to the height of the surface that you are lying on. Your upper arm should not move, and your elbow should stay bent. At the top of the movement, your palm should face the floor. Hold for 3 seconds. Slowly return to the starting position and relax your muscles. Repeat for a total of 10 repetitions. Repeat 2 times. Complete this exercise 3 times per week.  Exercise M: Horizontal abduction, standing  Sit on a stable chair, or stand. Secure an exercise band to a stable object in front of you so the band is at shoulder height. Hold one end of the  exercise band in each hand. Straighten your elbows and lift your hands straight in front of you, up to shoulder height.   Your palms should face down, toward the floor. Step back, away from the secured end of the exercise band, until the band stretches. Move your arms out to your sides, and keep your arms straight. Hold for 3 seconds. Slowly return to the starting position. Repeat for a total of 10 repetitions. Repeat 2 times. Complete this exercise 3 times per week.  Exercise N: Scapular retraction and elevation  Sit on a stable chair, or stand. Secure an exercise band to a stable object in front of you so the band is at shoulder height. Hold one end of the exercise band in each hand. Your palms should face each other. Sit in a stable chair without armrests, or stand. Step back, away from the secured end of the exercise band, until the band stretches. Squeeze your shoulder blades together and lift your hands over your head. Keep your elbows straight. Hold for 3 seconds. Slowly return to the starting position. Repeat for a total of 10 repetitions. Repeat 2 times. Complete this exercise 3 times per week.  This information is not intended to replace advice given to you by your health care provider. Make sure you discuss any questions you have with your health care provider. Document Released: 12/15/2005 Document Revised: 08/21/2016 Document Reviewed: 11/01/2015 Elsevier Interactive Patient Education  2017 [employer]  

## 2022-02-03 NOTE — Progress Notes (Signed)
Musculoskeletal Exam  Patient: Susan Galloway DOB: [birth year]  DOS: 02/03/2022  SUBJECTIVE:  Chief Complaint:   Chief Complaint  Patient presents with   Back Pain    Susan Galloway is a 21 y.o.  female for evaluation and treatment of upper back pain.   Onset:  2 weeks ago. No injr or change in activity.  Location: upper back on both sides Character:   cramping/spasming   Progression of issue:  has worsened Associated symptoms: no bruising, redness, swelling Denies bowel/bladder incontinence or weakness Treatment: to date has been massage and stretching.   Neurovascular symptoms: no  Past Medical History:  Diagnosis Date   Constipation    Environmental allergies    Seasonal allergies     Objective:  VITAL SIGNS: BP 108/66    Pulse 83    Temp 98.4 F (36.9 C) (Oral)    Ht 5\' 4"  (1.626 m)    Wt 134 lb 6 oz (61 kg)    SpO2 99%    BMI 23.07 kg/m  Constitutional: Well formed, well developed. No acute distress. HENT: Normocephalic, atraumatic.  Thorax & Lungs:  No accessory muscle use Musculoskeletal: upper back.   Tenderness to palpation: yes over b/l trap Deformity: no Ecchymosis: no Neg Spurling's b/l.  Neurologic: Normal sensory function. No focal deficits noted. DTR's equal and symmetric in UE's. No clonus. Psychiatric: Normal mood. Age appropriate judgment and insight. Alert & oriented x 3.    Assessment:  Strain of trapezius muscle, unspecified laterality, initial encounter - Plan: tiZANidine (ZANAFLEX) 4 MG tablet  Plan: Stretches/exercises, heat, ice, Tylenol, NSAIDs. Zanaflex prn. PT if no better.  F/u prn. The patient voiced understanding and agreement to the plan.   [place], DO 02/03/22  12:55 PM

## 2022-02-25 ENCOUNTER — Encounter: Payer: Self-pay | Admitting: Family Medicine

## 2022-02-26 ENCOUNTER — Encounter: Payer: Self-pay | Admitting: Family Medicine

## 2022-03-28 ENCOUNTER — Ambulatory Visit (INDEPENDENT_AMBULATORY_CARE_PROVIDER_SITE_OTHER): Payer: BC Managed Care – PPO | Admitting: Family Medicine

## 2022-03-28 ENCOUNTER — Encounter: Payer: Self-pay | Admitting: Family Medicine

## 2022-03-28 VITALS — BP 108/80 | HR 95 | Temp 98.8°F | Resp 16 | Ht 64.0 in | Wt 133.6 lb

## 2022-03-28 DIAGNOSIS — J029 Acute pharyngitis, unspecified: Secondary | ICD-10-CM | POA: Diagnosis not present

## 2022-03-28 MED ORDER — AMOXICILLIN 875 MG PO TABS: 875.0000 mg | ORAL_TABLET | Freq: Two times a day (BID) | ORAL | 0 refills | Status: AC

## 2022-03-28 NOTE — Progress Notes (Addendum)
Subjective:   By signing my name below, I, Susan Galloway, attest that this documentation has been prepared under the direction and in the presence of Susan Schultz, DO. 03/28/2022    Patient ID: Susan Galloway, female    DOB: 2001/01/19, 21 y.o.   MRN: [id number]  Chief Complaint  Patient presents with   Sore Throat    Pt states sxs started Tuesday and states sneezing a lot, headache, and body aches. Negative COVID test on Wednesday. Pt states trying an antihistaminine and states helping some.     HPI Patient is in today for an office visit.  She reports having a sore throat on Tuesday that has progressively gotten worse. She also reports headaches, cough, sneezing and ear pain. The cough is occasional and mostly when she wakes up. She is using zyrtec and Flonase tablets to manage the symptoms. Was not sure if it was allergies.   Negative for strep throat.   Past Medical History:  Diagnosis Date   Constipation    Environmental allergies    Seasonal allergies     Past Surgical History:  Procedure Laterality Date   Dental surgery      Family History  Problem Relation Age of Onset   Arthritis Mother        Living   Diabetes Mother    Hypertension Mother    Hyperlipidemia Mother    Thyroid disease Mother    Hypothyroidism Mother    Hypertension Father    Hyperlipidemia Father        Living   Kidney disease Sister    Crohn's disease Brother    Hyperlipidemia Brother        #2   Hypertension Brother        #2    Social History   Socioeconomic History   Marital status: Single    Spouse name: Not on file   Number of children: 0   Years of education: Not on file   Highest education level: Not on file  Occupational History   Occupation: Consulting civil engineer    Comment: Education officer, environmental at [employer]  Tobacco Use   Smoking status: Never   Smokeless tobacco: Never  Vaping Use   Vaping Use: Never used  Substance and Sexual Activity   Alcohol use: No    Alcohol/week: 0.0  standard drinks   Drug use: No   Sexual activity: Yes    Birth control/protection: Condom  Other Topics Concern   Not on file  Social History Narrative   Not on file   Social Determinants of Health   Financial Resource Strain: Not on file  Food Insecurity: Not on file  Transportation Needs: Not on file  Physical Activity: Not on file  Stress: Not on file  Social Connections: Not on file  Intimate Partner Violence: Not on file    Outpatient Medications Prior to Visit  Medication Sig Dispense Refill   Multiple Vitamins-Minerals (MULTIVITAMIN WITH MINERALS) tablet Take 1 tablet by mouth daily.     tiZANidine (ZANAFLEX) 4 MG tablet Take 1 tablet (4 mg total) by mouth every 6 (six) hours as needed for muscle spasms. 30 tablet 0   No facility-administered medications prior to visit.    Allergies  Allergen Reactions   Sulfa Antibiotics Hives    Review of Systems  Constitutional:  Negative for fever.  HENT:  Positive for ear pain and sore throat. Negative for congestion, hearing loss and sinus pain.   Eyes:  Negative for blurred  vision and pain.  Respiratory:  Positive for cough. Negative for sputum production, shortness of breath and wheezing.   Cardiovascular:  Negative for chest pain and palpitations.  Gastrointestinal:  Negative for blood in stool, constipation, diarrhea, nausea and vomiting.  Genitourinary:  Negative for dysuria, frequency, hematuria and urgency.  Musculoskeletal:  Negative for back pain, falls and myalgias.  Neurological:  Positive for headaches. Negative for dizziness, sensory change, loss of consciousness and weakness.  Endo/Heme/Allergies:  Negative for environmental allergies. Does not bruise/bleed easily.  Psychiatric/Behavioral:  Negative for depression and suicidal ideas. The patient is not nervous/anxious and does not have insomnia.       Objective:    Physical Exam Constitutional:      General: She is not in acute distress.    Appearance:  Normal appearance. She is not ill-appearing.  HENT:     Head: Normocephalic and atraumatic.     Right Ear: External ear normal.     Left Ear: External ear normal.     Mouth/Throat:     Pharynx: Posterior oropharyngeal erythema present.  Eyes:     Extraocular Movements: Extraocular movements intact.     Pupils: Pupils are equal, round, and reactive to light.  Cardiovascular:     Rate and Rhythm: Normal rate and regular rhythm.     Pulses: Normal pulses.     Heart sounds: Normal heart sounds. No murmur heard.   No gallop.  Pulmonary:     Effort: Pulmonary effort is normal. No respiratory distress.     Breath sounds: Normal breath sounds. No wheezing, rhonchi or rales.  Abdominal:     General: Bowel sounds are normal. There is no distension.     Palpations: Abdomen is soft. There is no mass.     Tenderness: There is no abdominal tenderness. There is no guarding or rebound.     Hernia: No hernia is present.  Musculoskeletal:     Cervical back: Normal range of motion and neck supple.  Lymphadenopathy:     Cervical: Cervical adenopathy present.  Skin:    General: Skin is warm and dry.  Neurological:     Mental Status: She is alert and oriented to person, place, and time.  Psychiatric:        Behavior: Behavior normal.    BP 108/80 (BP Location: Right Arm, Patient Position: Sitting, Cuff Size: Normal)   Pulse 95   Temp 98.8 F (37.1 C) (Oral)   Resp 16   Ht 5\' 4"  (1.626 m)   Wt 133 lb 9.6 oz (60.6 kg)   SpO2 99%   BMI 22.93 kg/m  Wt Readings from Last 3 Encounters:  03/28/22 133 lb 9.6 oz (60.6 kg)  02/03/22 134 lb 6 oz (61 kg)  09/27/21 125 lb (56.7 kg)    Diabetic Foot Exam - Simple   No data filed    Lab Results  Component Value Date   WBC 5.0 09/27/2021   HGB 13.0 09/27/2021   HCT 41.1 09/27/2021   PLT 324.0 09/27/2021   GLUCOSE 108 (H) 09/26/2021   CHOL 153 12/25/2020   TRIG 60.0 12/25/2020   HDL 90.40 12/25/2020   LDLCALC 51 12/25/2020   ALT 27  09/26/2021   AST 24 09/26/2021   NA 136 09/26/2021   K 4.7 09/26/2021   CL 103 09/26/2021   CREATININE 0.74 09/26/2021   BUN 10 09/26/2021   CO2 23 09/26/2021   TSH 1.25 04/06/2020    Lab Results  Component Value Date  TSH 1.25 04/06/2020   Lab Results  Component Value Date   WBC 5.0 09/27/2021   HGB 13.0 09/27/2021   HCT 41.1 09/27/2021   MCV 82.1 09/27/2021   PLT 324.0 09/27/2021   Lab Results  Component Value Date   NA 136 09/26/2021   K 4.7 09/26/2021   CO2 23 09/26/2021   GLUCOSE 108 (H) 09/26/2021   BUN 10 09/26/2021   CREATININE 0.74 09/26/2021   BILITOT 0.9 09/26/2021   ALKPHOS 32 (L) 09/26/2021   AST 24 09/26/2021   ALT 27 09/26/2021   PROT 7.6 09/26/2021   ALBUMIN 4.0 09/26/2021   CALCIUM 9.5 09/26/2021   ANIONGAP 10 09/26/2021   GFR 105.35 12/25/2020   Lab Results  Component Value Date   CHOL 153 12/25/2020   Lab Results  Component Value Date   HDL 90.40 12/25/2020   Lab Results  Component Value Date   LDLCALC 51 12/25/2020   Lab Results  Component Value Date   TRIG 60.0 12/25/2020   Lab Results  Component Value Date   CHOLHDL 2 12/25/2020   No results found for: HGBA1C     Assessment & Plan:   Problem List Items Addressed This Visit   None Visit Diagnoses     Pharyngitis, unspecified etiology    -  Primary   Relevant Medications   amoxicillin (AMOXIL) 875 MG tablet       Meds ordered this encounter  Medications   amoxicillin (AMOXIL) 875 MG tablet    Sig: Take 1 tablet (875 mg total) by mouth 2 (two) times daily for 10 days.    Dispense:  20 tablet    Refill:  0    I,Susan Galloway,acting as a scribe for [employer], DO.,have documented all relevant documentation on the behalf of Susan Schultz, DO,as directed by  Susan Schultz, DO while in the presence of Susan Schultz, DO.   I, Susan Schultz, DO., personally preformed the services described in this documentation.  All medical record  entries made by the scribe were at my direction and in my presence.  I have reviewed the chart and discharge instructions (if applicable) and agree that the record reflects my personal performance and is accurate and complete. 03/28/2022

## 2022-03-28 NOTE — Assessment & Plan Note (Signed)
Amoxil x 10 days  ?flonase and antihistamine  ?F/u prn  ?Strep neg but sore throat for 5 days  ?

## 2022-03-28 NOTE — Patient Instructions (Signed)
Pharyngitis °Pharyngitis is inflammation of the throat (pharynx). It is a very common cause of sore throat. Pharyngitis can be caused by a bacteria, but it is usually caused by a virus. Most cases of pharyngitis get better on their own without treatment. °What are the causes? °This condition may be caused by: °Infection by viruses (viral). Viral pharyngitis spreads easily from person to person (is contagious) through coughing, sneezing, and sharing of personal items or utensils such as cups, forks, spoons, and toothbrushes. °Infection by bacteria (bacterial). Bacterial pharyngitis may be spread by touching the nose or face after coming in contact with the bacteria, or through close contact, such as kissing. °Allergies. Allergies can cause buildup of mucus in the throat (post-nasal drip), leading to inflammation and irritation. Allergies can also cause blocked nasal passages, forcing breathing through the mouth, which dries and irritates the throat. °What increases the risk? °You are more likely to develop this condition if: °You are 5-24 years old. °You are exposed to crowded environments such as daycare, school, or dormitory living. °You live in a cold climate. °You have a weakened disease-fighting (immune) system. °What are the signs or symptoms? °Symptoms of this condition vary by the cause. Common symptoms of this condition include: °Sore throat. °Fatigue. °Low-grade fever. °Stuffy nose (nasal congestion) and cough. °Headache. °Other symptoms may include: °Glands in the neck (lymph nodes) that are swollen. °Skin rashes. °Plaque-like film on the throat or tonsils. This is often a symptom of bacterial pharyngitis. °Vomiting. °Red, itchy eyes (conjunctivitis). °Loss of appetite. °Joint pain and muscle aches. °Enlarged tonsils. °How is this diagnosed? °This condition may be diagnosed based on your medical history and a physical exam. Your health care provider will ask you questions about your illness and your  symptoms. °A swab of your throat may be done to check for bacteria (rapid strep test). Other lab tests may also be done, depending on the suspected cause, but these are rare. °How is this treated? °Many times, treatment is not needed for this condition. Pharyngitis usually gets better in 3-4 days without treatment. °Bacterial pharyngitis may be treated with antibiotic medicines. °Follow these instructions at home: °Medicines °Take over-the-counter and prescription medicines only as told by your health care provider. °If you were prescribed an antibiotic medicine, take it as told by your health care provider. Do not stop taking the antibiotic even if you start to feel better. °Use throat sprays to soothe your throat as told by your health care provider. °Children can get pharyngitis. Do not give your child aspirin because of the association with Reye's syndrome. °Managing pain °To help with pain, try: °Sipping warm liquids, such as broth, herbal tea, or warm water. °Eating or drinking cold or frozen liquids, such as frozen ice pops. °Gargling with a mixture of salt and water 3-4 times a day or as needed. To make salt water, completely dissolve ½-1 tsp (3-6 g) of salt in 1 cup (237 mL) of warm water. °Sucking on hard candy or throat lozenges. °Putting a cool-mist humidifier in your bedroom at night to moisten the air. °Sitting in the bathroom with the door closed for 5-10 minutes while you run hot water in the shower. ° °General instructions ° °Do not use any products that contain nicotine or tobacco. These products include cigarettes, chewing tobacco, and vaping devices, such as e-cigarettes. If you need help quitting, ask your health care provider. °Rest as told by your health care provider. °Drink enough fluid to keep your urine pale yellow. °How   is this prevented? °To help prevent becoming infected or spreading infection: °Wash your hands often with soap and water for at least 20 seconds. If soap and water are not  available, use hand sanitizer. °Do not touch your eyes, nose, or mouth with unwashed hands, and wash hands after touching these areas. °Do not share cups or eating utensils. °Avoid close contact with people who are sick. °Contact a health care provider if: °You have large, tender lumps in your neck. °You have a rash. °You cough up green, yellow-brown, or bloody mucus. °Get help right away if: °Your neck becomes stiff. °You drool or are unable to swallow liquids. °You cannot drink or take medicines without vomiting. °You have severe pain that does not go away, even after you take medicine. °You have trouble breathing, and it is not caused by a stuffy nose. °You have new pain and swelling in your joints such as the knees, ankles, wrists, or elbows. °These symptoms may represent a serious problem that is an emergency. Do not wait to see if the symptoms will go away. Get medical help right away. Call your local emergency services (911 in the [place] Do not drive yourself to the hospital. °Summary °Pharyngitis is redness, pain, and swelling (inflammation) of the throat (pharynx). °While pharyngitis can be caused by a bacteria, the most common causes are viral. °Most cases of pharyngitis get better on their own without treatment. °Bacterial pharyngitis is treated with antibiotic medicines. °This information is not intended to replace advice given to you by your health care provider. Make sure you discuss any questions you have with your health care provider. °Document Revised: 03/13/2021 Document Reviewed: 03/13/2021 °Elsevier Patient Education © 2022 [employer] ° °

## 2022-04-02 DIAGNOSIS — Z111 Encounter for screening for respiratory tuberculosis: Secondary | ICD-10-CM | POA: Diagnosis not present

## 2022-04-03 ENCOUNTER — Encounter: Payer: Self-pay | Admitting: Family Medicine

## 2022-04-03 ENCOUNTER — Other Ambulatory Visit (INDEPENDENT_AMBULATORY_CARE_PROVIDER_SITE_OTHER): Payer: BC Managed Care – PPO

## 2022-04-03 DIAGNOSIS — Z111 Encounter for screening for respiratory tuberculosis: Secondary | ICD-10-CM

## 2022-04-06 LAB — QUANTIFERON-TB GOLD PLUS
Mitogen-NIL: >10 IU/mL
NIL: 0.03 IU/mL
QuantiFERON-TB Gold Plus: NEGATIVE
TB1-NIL: <0 IU/mL
TB2-NIL: <0 IU/mL

## 2022-04-07 ENCOUNTER — Encounter: Payer: Self-pay | Admitting: Family Medicine

## 2022-08-06 ENCOUNTER — Ambulatory Visit: Payer: BC Managed Care – PPO | Admitting: Family Medicine

## 2022-08-14 DIAGNOSIS — S43491A Other sprain of right shoulder joint, initial encounter: Secondary | ICD-10-CM | POA: Diagnosis not present

## 2022-09-18 ENCOUNTER — Other Ambulatory Visit (HOSPITAL_COMMUNITY)
Admission: RE | Admit: 2022-09-18 | Discharge: 2022-09-18 | Disposition: A | Discharge status: Home / Self Care | Payer: BC Managed Care – PPO | Source: Ambulatory Visit | Attending: Family Medicine | Admitting: Family Medicine

## 2022-09-18 ENCOUNTER — Encounter: Payer: Self-pay | Admitting: Family Medicine

## 2022-09-18 ENCOUNTER — Ambulatory Visit (INDEPENDENT_AMBULATORY_CARE_PROVIDER_SITE_OTHER): Payer: BC Managed Care – PPO | Admitting: Family Medicine

## 2022-09-18 VITALS — BP 110/68 | HR 96 | Temp 98.2°F | Ht 64.0 in | Wt 140.0 lb

## 2022-09-18 DIAGNOSIS — N760 Acute vaginitis: Secondary | ICD-10-CM | POA: Insufficient documentation

## 2022-09-18 DIAGNOSIS — N898 Other specified noninflammatory disorders of vagina: Secondary | ICD-10-CM

## 2022-09-18 DIAGNOSIS — A5602 Chlamydial vulvovaginitis: Secondary | ICD-10-CM | POA: Diagnosis not present

## 2022-09-18 MED ORDER — METRONIDAZOLE 500 MG PO TABS: 500.0000 mg | ORAL_TABLET | Freq: Two times a day (BID) | ORAL | 0 refills | Status: AC

## 2022-09-18 NOTE — Patient Instructions (Signed)
Practice safe sex.  Stay hydrated.  Consider a vaginal probiotic like repHresh if this becomes a recurrent thing.  Let us know if you need anything.

## 2022-09-18 NOTE — Progress Notes (Signed)
Chief Complaint  Patient presents with   vaginal odor     Going on a week.     Susan Galloway is a 21 y.o. female here for vaginal odor.  Duration: 1 week No D/C.  Odor: Yes New sexual partner: No Urinary complaints: No Denies fevers, bleeding, pregnancy abdominal pain.  Past Medical History:  Diagnosis Date   Constipation    Environmental allergies    Seasonal allergies    Family History  Problem Relation Age of Onset   Arthritis Mother        Living   Diabetes Mother    Hypertension Mother    Hyperlipidemia Mother    Thyroid disease Mother    Hypothyroidism Mother    Hypertension Father    Hyperlipidemia Father        Living   Kidney disease Sister    Crohn's disease Brother    Hyperlipidemia Brother        #2   Hypertension Brother        #2    BP 110/68   Pulse 96   Temp 98.2 F (36.8 C) (Oral)   Ht 5\' 4"  (1.626 m)   Wt 140 lb (63.5 kg)   LMP 08/19/2022   SpO2 99%   BMI 24.03 kg/m  Gen: Awake, alert, appears stated age Heart: RRR Lungs: CTAB, no accessory muscle use Abd: BS+, soft, NT, ND, no masses or organomegaly GU: deferred Psych: Age appropriate judgment and insight, nml mood and affect  Vaginal discharge - Plan: metroNIDAZOLE (FLAGYL) 500 MG tablet  7 d of Flagyl bid, avoid EtOH. Stay hydrated. Self swab for ancillary testing.  Pt voiced understanding and agreement to the plan.  [place], DO 09/18/22 12:43 PM

## 2022-09-21 ENCOUNTER — Other Ambulatory Visit: Payer: Self-pay | Admitting: Family

## 2022-09-21 LAB — CERVICOVAGINAL ANCILLARY ONLY
Bacterial Vaginitis (gardnerella): POSITIVE — AB
Candida Glabrata: NEGATIVE
Candida Vaginitis: NEGATIVE
Chlamydia: POSITIVE — AB
Comment: NEGATIVE
Comment: NEGATIVE
Comment: NEGATIVE
Comment: NEGATIVE
Comment: NEGATIVE
Comment: NORMAL
Neisseria Gonorrhea: NEGATIVE
Trichomonas: NEGATIVE

## 2022-09-21 MED ORDER — DOXYCYCLINE HYCLATE 100 MG PO TABS: 100.0000 mg | ORAL_TABLET | Freq: Two times a day (BID) | ORAL | 0 refills | Status: DC

## 2022-09-22 ENCOUNTER — Encounter: Payer: Self-pay | Admitting: Family Medicine

## 2022-10-03 ENCOUNTER — Ambulatory Visit: Payer: BC Managed Care – PPO

## 2022-10-03 ENCOUNTER — Other Ambulatory Visit (HOSPITAL_COMMUNITY)
Admission: RE | Admit: 2022-10-03 | Discharge: 2022-10-03 | Disposition: A | Discharge status: Home / Self Care | Payer: BC Managed Care – PPO | Source: Ambulatory Visit | Attending: Family Medicine | Admitting: Family Medicine

## 2022-10-03 VITALS — BP 118/88 | HR 81 | Wt 138.0 lb

## 2022-10-03 DIAGNOSIS — Z113 Encounter for screening for infections with a predominantly sexual mode of transmission: Secondary | ICD-10-CM | POA: Insufficient documentation

## 2022-10-03 NOTE — Progress Notes (Signed)
Patient presents for routine STI screening . Patient states she tested positive for Chlamydia 2 weeks ago and  finished her medication on Wednesday.. Pt requests to retest early. Pt made aware that it may be too early to [place] was sent to the lab and patient was sent to the lab to have STI blood test.  Remigio Mcmillon l Hamish Banks, CMA

## 2022-10-04 LAB — RPR: RPR Ser Ql: NONREACTIVE

## 2022-10-04 LAB — HEPATITIS C ANTIBODY: Hep C Virus Ab: NONREACTIVE

## 2022-10-04 LAB — HEPATITIS B SURFACE ANTIGEN: Hepatitis B Surface Ag: NEGATIVE

## 2022-10-04 LAB — HIV ANTIBODY (ROUTINE TESTING W REFLEX): HIV Screen 4th Generation wRfx: NONREACTIVE

## 2022-10-06 LAB — CERVICOVAGINAL ANCILLARY ONLY
Chlamydia: NEGATIVE
Comment: NEGATIVE
Comment: NEGATIVE
Comment: NORMAL
Neisseria Gonorrhea: NEGATIVE
Trichomonas: NEGATIVE

## 2022-10-21 ENCOUNTER — Ambulatory Visit: Payer: BC Managed Care – PPO | Admitting: Advanced Practice Midwife

## 2022-10-21 DIAGNOSIS — R6883 Chills (without fever): Secondary | ICD-10-CM | POA: Diagnosis not present

## 2022-10-21 DIAGNOSIS — J029 Acute pharyngitis, unspecified: Secondary | ICD-10-CM | POA: Diagnosis not present

## 2022-11-18 DIAGNOSIS — Z113 Encounter for screening for infections with a predominantly sexual mode of transmission: Secondary | ICD-10-CM | POA: Diagnosis not present

## 2022-12-13 DIAGNOSIS — M5412 Radiculopathy, cervical region: Secondary | ICD-10-CM | POA: Diagnosis not present

## 2022-12-13 DIAGNOSIS — S29012A Strain of muscle and tendon of back wall of thorax, initial encounter: Secondary | ICD-10-CM | POA: Diagnosis not present

## 2022-12-30 ENCOUNTER — Ambulatory Visit (INDEPENDENT_AMBULATORY_CARE_PROVIDER_SITE_OTHER): Payer: BLUE CROSS/BLUE SHIELD | Admitting: Family Medicine

## 2022-12-30 ENCOUNTER — Encounter: Payer: Self-pay | Admitting: Family Medicine

## 2022-12-30 VITALS — BP 138/78 | HR 95 | Temp 99.1°F | Ht 64.0 in | Wt 141.1 lb

## 2022-12-30 DIAGNOSIS — S46811A Strain of other muscles, fascia and tendons at shoulder and upper arm level, right arm, initial encounter: Secondary | ICD-10-CM | POA: Diagnosis not present

## 2022-12-30 MED ORDER — METHOCARBAMOL 500 MG PO TABS: 500.0000 mg | ORAL_TABLET | Freq: Three times a day (TID) | ORAL | 0 refills | Status: DC | PRN

## 2022-12-30 NOTE — Progress Notes (Signed)
Musculoskeletal Exam  Patient: Susan Galloway DOB: December 22, 2001  DOS: 12/30/2022  SUBJECTIVE:  Chief Complaint:   Chief Complaint  Patient presents with   Follow-up    Pinched nerve in back has improved, but not completely    Susan Galloway is a 22 y.o.  female for evaluation and treatment of neck pain.   Onset:  2 weeks ago. No inj or change in activity.  Location: back of L portion of neck Character:  aching  Progression of issue:  has slightly improved Associated symptoms: was radiating down R side Treatment: to date has been muscle relaxers. Heat/cold pack.  Neurovascular symptoms: no  Past Medical History:  Diagnosis Date   Constipation    Environmental allergies    Seasonal allergies     Objective: VITAL SIGNS: BP 138/78 (BP Location: Left Arm, Patient Position: Sitting, Cuff Size: Normal)   Pulse 95   Temp 99.1 F (37.3 C) (Oral)   Ht 5\' 4"  (1.626 m)   Wt 141 lb 2 oz (64 kg)   SpO2 98%   BMI 24.22 kg/m  Constitutional: Well formed, well developed. No acute distress. Thorax & Lungs: No accessory muscle use Musculoskeletal: neck.   Normal active range of motion: yes.   Normal passive range of motion: yes Tenderness to palpation: Yes, over bilateral trapezius muscles, worse on the right Deformity: no Ecchymosis: no Tests positive: None Tests negative: Spurling's Neurologic: Normal sensory function. No focal deficits noted. DTR's equal and symmetric in UE's. No clonus.  Grip strength adequate bilaterally. Psychiatric: Normal mood. Age appropriate judgment and insight. Alert & oriented x 3.    Assessment:  Trapezius strain, right, initial encounter - Plan: methocarbamol (ROBAXIN) 500 MG tablet  Plan: Stretches/exercises for the trapezius provided, Robaxin as needed, heat, ice, Tylenol.  Physical therapy if no better in the next 3 to 4 weeks. F/u as originally scheduled. The patient voiced understanding and agreement to the plan.   [place],  DO 12/30/22  4:39 PM

## 2022-12-30 NOTE — Patient Instructions (Signed)
Heat (pad or rice pillow in microwave) over affected area, 10-15 minutes twice daily.   Ice/cold pack over area for 10-15 min twice daily.  OK to take Tylenol 1000 mg (2 extra strength tabs) or 975 mg (3 regular strength tabs) every 6 hours as needed.  Ibuprofen 400-600 mg (2-3 over the counter strength tabs) every 6 hours as needed for pain.  Send me a message in a few weeks if not improving.  Let us know if you need anything.  Trapezius stretches/exercises Do exercises exactly as told by your health care provider and adjust them as directed. It is normal to feel mild stretching, pulling, tightness, or discomfort as you do these exercises, but you should stop right away if you feel sudden pain or your pain gets worse.   Stretching and range of motion exercises These exercises warm up your muscles and joints and improve the movement and flexibility of your shoulder. These exercises can also help to relieve pain, numbness, and tingling. If you are unable to do any of the following for any reason, do not further attempt to do it.   Exercise A: Flexion, standing     Stand and hold a broomstick, a cane, or a similar object. Place your hands a little more than shoulder-width apart on the object. Your left / right hand should be palm-up, and your other hand should be palm-down. Push the stick to raise your left / right arm out to your side and then over your head. Use your other hand to help move the stick. Stop when you feel a stretch in your shoulder, or when you reach the angle that is recommended by your health care provider. Avoid shrugging your shoulder while you raise your arm. Keep your shoulder blade tucked down toward your spine. Hold for 30 seconds. Slowly return to the starting position. Repeat 2 times. Complete this exercise 3 times per week.  Exercise B: Abduction, supine     Lie on your back and hold a broomstick, a cane, or a similar object. Place your hands a little more  than shoulder-width apart on the object. Your left / right hand should be palm-up, and your other hand should be palm-down. Push the stick to raise your left / right arm out to your side and then over your head. Use your other hand to help move the stick. Stop when you feel a stretch in your shoulder, or when you reach the angle that is recommended by your health care provider. Avoid shrugging your shoulder while you raise your arm. Keep your shoulder blade tucked down toward your spine. Hold for 30 seconds. Slowly return to the starting position. Repeat 2 times. Complete this exercise 3 times per week.  Exercise C: Flexion, active-assisted     Lie on your back. You may bend your knees for comfort. Hold a broomstick, a cane, or a similar object. Place your hands about shoulder-width apart on the object. Your palms should face toward your feet. Raise the stick and move your arms over your head and behind your head, toward the floor. Use your healthy arm to help your left / right arm move farther. Stop when you feel a gentle stretch in your shoulder, or when you reach the angle where your health care provider tells you to stop. Hold for 30 seconds. Slowly return to the starting position. Repeat 2 times. Complete this exercise 3 times per week.  Exercise D: External rotation and abduction     Stand  in a door frame with one of your feet slightly in front of the other. This is called a staggered stance. Choose one of the following positions as told by your health care provider: Place your hands and forearms on the door frame above your head. Place your hands and forearms on the door frame at the height of your head. Place your hands on the door frame at the height of your elbows. Slowly move your weight onto your front foot until you feel a stretch across your chest and in the front of your shoulders. Keep your head and chest upright and keep your abdominal muscles tight. Hold for 30  seconds. To release the stretch, shift your weight to your back foot. Repeat 2 times. Complete this stretch 3 times per week.  Strengthening exercises These exercises build strength and endurance in your shoulder. Endurance is the ability to use your muscles for a long time, even after your muscles get tired. Exercise E: Scapular depression and adduction  Sit on a stable chair. Support your arms in front of you with pillows, armrests, or a tabletop. Keep your elbows in line with the sides of your body. Gently move your shoulder blades down toward your middle back. Relax the muscles on the tops of your shoulders and in the back of your neck. Hold for 3 seconds. Slowly release the tension and relax your muscles completely before doing this exercise again. Repeat for a total of 10 repetitions. After you have practiced this exercise, try doing the exercise without the arm support. Then, try the exercise while standing instead of sitting. Repeat 2 times. Complete this exercise 3 times per week.  Exercise F: Shoulder abduction, isometric     Stand or sit about 4-6 inches (10-15 cm) from a wall with your left / right side facing the wall. Bend your left / right elbow and gently press your elbow against the wall. Increase the pressure slowly until you are pressing as hard as you can without shrugging your shoulder. Hold for 3 seconds. Slowly release the tension and relax your muscles completely. Repeat for a total of 10 repetitions. Repeat 2 times. Complete this exercise 3 times per week.  Exercise G: Shoulder flexion, isometric     Stand or sit about 4-6 inches (10-15 cm) away from a wall with your left / right side facing the wall. Keep your left / right elbow straight and gently press the top of your fist against the wall. Increase the pressure slowly until you are pressing as hard as you can without shrugging your shoulder. Hold for 10-15 seconds. Slowly release the tension and relax your  muscles completely. Repeat for a total of 10 repetitions. Repeat 2 times. Complete this exercise 3 times per week.  Exercise H: Internal rotation     Sit in a stable chair without armrests, or stand. Secure an exercise band at your left / right side, at elbow height. Place a soft object, such as a folded towel or a small pillow, under your left / right upper arm so your elbow is a few inches (about 8 cm) away from your side. Hold the end of the exercise band so the band stretches. Keeping your elbow pressed against the soft object under your arm, move your forearm across your body toward your abdomen. Keep your body steady so the movement is only coming from your shoulder. Hold for 3 seconds. Slowly return to the starting position. Repeat for a total of 10 repetitions. Repeat  2 times. Complete this exercise 3 times per week.  Exercise I: External rotation     Sit in a stable chair without armrests, or stand. Secure an exercise band at your left / right side, at elbow height. Place a soft object, such as a folded towel or a small pillow, under your left / right upper arm so your elbow is a few inches (about 8 cm) away from your side. Hold the end of the exercise band so the band stretches. Keeping your elbow pressed against the soft object under your arm, move your forearm out, away from your abdomen. Keep your body steady so the movement is only coming from your shoulder. Hold for 3 seconds. Slowly return to the starting position. Repeat for a total of 10 repetitions. Repeat 2 times. Complete this exercise 3 times per week. Exercise J: Shoulder extension  Sit in a stable chair without armrests, or stand. Secure an exercise band to a stable object in front of you so the band is at shoulder height. Hold one end of the exercise band in each hand. Your palms should face each other. Straighten your elbows and lift your hands up to shoulder height. Step back, away from the secured end of the  exercise band, until the band stretches. Squeeze your shoulder blades together and pull your hands down to the sides of your thighs. Stop when your hands are straight down by your sides. Do not let your hands go behind your body. Hold for 3 seconds. Slowly return to the starting position. Repeat for a total of 10 repetitions. Repeat 2 times. Complete this exercise 3 times per week.  Exercise K: Shoulder extension, prone     Lie on your abdomen on a firm surface so your left / right arm hangs over the edge. Hold a 5 lb weight in your hand so your palm faces in toward your body. Your arm should be straight. Squeeze your shoulder blade down toward the middle of your back. Slowly raise your arm behind you, up to the height of the surface that you are lying on. Keep your arm straight. Hold for 3 seconds. Slowly return to the starting position and relax your muscles. Repeat for a total of 10 repetitions. Repeat 2 times. Complete this exercise 3 times per week.   Exercise L: Horizontal abduction, prone  Lie on your abdomen on a firm surface so your left / right arm hangs over the edge. Hold a 5 lb weight in your hand so your palm faces toward your feet. Your arm should be straight. Squeeze your shoulder blade down toward the middle of your back. Bend your elbow so your hand moves up, until your elbow is bent to an "L" shape (90 degrees). With your elbow bent, slowly move your forearm forward and up. Raise your hand up to the height of the surface that you are lying on. Your upper arm should not move, and your elbow should stay bent. At the top of the movement, your palm should face the floor. Hold for 3 seconds. Slowly return to the starting position and relax your muscles. Repeat for a total of 10 repetitions. Repeat 2 times. Complete this exercise 3 times per week.  Exercise M: Horizontal abduction, standing  Sit on a stable chair, or stand. Secure an exercise band to a stable object in  front of you so the band is at shoulder height. Hold one end of the exercise band in each hand. Straighten your elbows and  lift your hands straight in front of you, up to shoulder height. Your palms should face down, toward the floor. Step back, away from the secured end of the exercise band, until the band stretches. Move your arms out to your sides, and keep your arms straight. Hold for 3 seconds. Slowly return to the starting position. Repeat for a total of 10 repetitions. Repeat 2 times. Complete this exercise 3 times per week.  Exercise N: Scapular retraction and elevation  Sit on a stable chair, or stand. Secure an exercise band to a stable object in front of you so the band is at shoulder height. Hold one end of the exercise band in each hand. Your palms should face each other. Sit in a stable chair without armrests, or stand. Step back, away from the secured end of the exercise band, until the band stretches. Squeeze your shoulder blades together and lift your hands over your head. Keep your elbows straight. Hold for 3 seconds. Slowly return to the starting position. Repeat for a total of 10 repetitions. Repeat 2 times. Complete this exercise 3 times per week.  This information is not intended to replace advice given to you by your health care provider. Make sure you discuss any questions you have with your health care provider. Document Released: 12/15/2005 Document Revised: 08/21/2016 Document Reviewed: 11/01/2015 Elsevier Interactive Patient Education  2017 [employer].

## 2023-01-02 DIAGNOSIS — Z111 Encounter for screening for respiratory tuberculosis: Secondary | ICD-10-CM | POA: Diagnosis not present

## 2023-01-02 DIAGNOSIS — Z3202 Encounter for pregnancy test, result negative: Secondary | ICD-10-CM | POA: Diagnosis not present

## 2023-01-02 DIAGNOSIS — Z01419 Encounter for gynecological examination (general) (routine) without abnormal findings: Secondary | ICD-10-CM | POA: Diagnosis not present

## 2023-01-02 DIAGNOSIS — Z124 Encounter for screening for malignant neoplasm of cervix: Secondary | ICD-10-CM | POA: Diagnosis not present

## 2023-01-02 DIAGNOSIS — Z3009 Encounter for other general counseling and advice on contraception: Secondary | ICD-10-CM | POA: Diagnosis not present

## 2023-01-02 DIAGNOSIS — R87612 Low grade squamous intraepithelial lesion on cytologic smear of cervix (LGSIL): Secondary | ICD-10-CM | POA: Diagnosis not present

## 2023-01-02 DIAGNOSIS — Z789 Other specified health status: Secondary | ICD-10-CM | POA: Diagnosis not present

## 2023-02-06 ENCOUNTER — Ambulatory Visit: Payer: BLUE CROSS/BLUE SHIELD | Admitting: Family Medicine

## 2023-03-02 ENCOUNTER — Ambulatory Visit: Payer: BLUE CROSS/BLUE SHIELD | Admitting: Family Medicine

## 2023-03-02 DIAGNOSIS — R300 Dysuria: Secondary | ICD-10-CM | POA: Diagnosis not present

## 2023-03-02 DIAGNOSIS — N898 Other specified noninflammatory disorders of vagina: Secondary | ICD-10-CM | POA: Diagnosis not present

## 2023-03-24 ENCOUNTER — Telehealth: Payer: Self-pay | Admitting: Family Medicine

## 2023-03-24 ENCOUNTER — Other Ambulatory Visit: Payer: Self-pay | Admitting: Family Medicine

## 2023-03-24 DIAGNOSIS — Z111 Encounter for screening for respiratory tuberculosis: Secondary | ICD-10-CM

## 2023-03-24 NOTE — Telephone Encounter (Signed)
Called and scheduled lab appt/ put in order for tomorrow

## 2023-03-24 NOTE — Telephone Encounter (Signed)
Susan Galloway (mother DPR OK) called stating that pt needs to get a TB blood test for work and she would like to have it done on 3.27.24.

## 2023-03-24 NOTE — Telephone Encounter (Signed)
That is fine to order

## 2023-03-25 ENCOUNTER — Other Ambulatory Visit (INDEPENDENT_AMBULATORY_CARE_PROVIDER_SITE_OTHER): Payer: BC Managed Care – PPO

## 2023-03-25 DIAGNOSIS — Z111 Encounter for screening for respiratory tuberculosis: Secondary | ICD-10-CM | POA: Diagnosis not present

## 2023-03-27 LAB — QUANTIFERON-TB GOLD PLUS
Mitogen-NIL: >10 IU/mL
NIL: 0.05 IU/mL
QuantiFERON-TB Gold Plus: NEGATIVE
TB1-NIL: 0.01 IU/mL
TB2-NIL: 0.01 IU/mL

## 2023-05-15 DIAGNOSIS — J029 Acute pharyngitis, unspecified: Secondary | ICD-10-CM | POA: Diagnosis not present

## 2023-10-07 ENCOUNTER — Encounter: Payer: Self-pay | Admitting: Family Medicine

## 2023-10-07 ENCOUNTER — Ambulatory Visit: Payer: BC Managed Care – PPO | Admitting: Family Medicine

## 2023-10-07 VITALS — BP 130/83 | HR 78 | Temp 98.4°F | Ht 64.0 in | Wt 137.2 lb

## 2023-10-07 DIAGNOSIS — R059 Cough, unspecified: Secondary | ICD-10-CM

## 2023-10-07 DIAGNOSIS — J0140 Acute pansinusitis, unspecified: Secondary | ICD-10-CM | POA: Diagnosis not present

## 2023-10-07 LAB — POC COVID19 BINAXNOW: SARS Coronavirus 2 Ag: NEGATIVE

## 2023-10-07 LAB — POCT INFLUENZA A/B
Influenza A, POC: NEGATIVE
Influenza B, POC: NEGATIVE

## 2023-10-07 MED ORDER — PREDNISONE 20 MG PO TABS: 40.0000 mg | ORAL_TABLET | Freq: Every day | ORAL | 0 refills | Status: AC

## 2023-10-07 MED ORDER — ALBUTEROL SULFATE HFA 108 (90 BASE) MCG/ACT IN AERS: 2.0000 | INHALATION_SPRAY | Freq: Four times a day (QID) | RESPIRATORY_TRACT | 0 refills | Status: AC | PRN

## 2023-10-07 MED ORDER — DOXYCYCLINE HYCLATE 100 MG PO TABS: 100.0000 mg | ORAL_TABLET | Freq: Two times a day (BID) | ORAL | 0 refills | Status: AC

## 2023-10-07 NOTE — Progress Notes (Signed)
Chief Complaint  Patient presents with   Sore Throat   Cough    Body aches Chills     Susan Galloway here for URI complaints.  Duration: 3 days  Associated symptoms: Fever (101.2 F), sinus congestion, sinus pain, rhinorrhea, itchy watery eyes, ear pain, sore throat, shortness of breath, chest tightness, myalgia, and cough Denies: ear drainage, wheezing, and N/V/D Treatment to date: Tylenol, ibuprofen, Mucinex, Dayquil, Nyquil Sick contacts: No  Past Medical History:  Diagnosis Date   Constipation    Environmental allergies    Seasonal allergies     Objective BP 130/83 (BP Location: Left Arm, Patient Position: Sitting, Cuff Size: Normal)   Pulse 78   Temp 98.4 F (36.9 C) (Oral)   Ht 5\' 4"  (1.626 m)   Wt 137 lb 4 oz (62.3 kg)   SpO2 97%   BMI 23.56 kg/m  General: Awake, alert, appears stated age HEENT: AT, [place], ears patent b/l and TM's neg, nares patent w/o discharge, pharynx pink and without exudates, MMM, +TTP over frontal and max sinuses b/l Neck: No masses or asymmetry Heart: RRR Lungs: CTAB, no accessory muscle use Psych: Age appropriate judgment and insight, normal mood and affect  Acute pansinusitis, recurrence not specified - Plan: doxycycline (VIBRA-TABS) 100 MG tablet, predniSONE (DELTASONE) 20 MG tablet  Cough, unspecified type - Plan: POC COVID-19, POCT Influenza A/B, albuterol (VENTOLIN HFA) 108 (90 Base) MCG/ACT inhaler  COVID and flu testing negative.  Will treat with a 5-day prednisone burst 40 mg daily.  No improvement in next 3 days, she will take 7 days of doxycycline.  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  Continue to push fluids, practice good hand hygiene, cover mouth when coughing. F/u prn. If starting to experience worsening symptoms, shaking, or shortness of breath, seek immediate care. Pt voiced understanding and agreement to the plan.  [name] [place], DO 10/07/23 3:25 PM

## 2023-10-07 NOTE — Patient Instructions (Signed)
Continue to push fluids, practice good hand hygiene, and cover your mouth if you cough.  If you start having fevers, shaking or shortness of breath, seek immediate care.  If no better on the prednisone by Saturday, please take the antibiotic.   OK to take Tylenol 1000 mg (2 extra strength tabs) or 975 mg (3 regular strength tabs) every 6 hours as needed.  Let us know if you need anything.

## 2023-10-08 ENCOUNTER — Other Ambulatory Visit: Payer: Self-pay | Admitting: Family Medicine

## 2023-10-08 ENCOUNTER — Telehealth: Payer: Self-pay | Admitting: Family Medicine

## 2023-10-08 ENCOUNTER — Other Ambulatory Visit (HOSPITAL_BASED_OUTPATIENT_CLINIC_OR_DEPARTMENT_OTHER): Payer: Self-pay

## 2023-10-08 MED ORDER — POLYMYXIN B-TRIMETHOPRIM 10000-0.1 UNIT/ML-% OP SOLN
1.0000 [drp] | OPHTHALMIC | 0 refills | Status: DC
  Filled 2023-10-08: qty 10, 34d supply, fill #0

## 2023-10-08 NOTE — Telephone Encounter (Signed)
Pt.notified

## 2023-10-08 NOTE — Telephone Encounter (Signed)
Pt called and stated that this morning, she woke up with symptoms similar to pink eye. She mentioned that Dr. Carmelia Roller prescribed her Predisone and was advised to start taking it Saturday. However, pt wanted to ask pcp if she could start since she thinks she may have pink eye. Please advise.

## 2023-10-21 ENCOUNTER — Other Ambulatory Visit (HOSPITAL_BASED_OUTPATIENT_CLINIC_OR_DEPARTMENT_OTHER): Payer: Self-pay

## 2023-11-04 ENCOUNTER — Encounter: Payer: Self-pay | Admitting: Family Medicine

## 2023-11-04 ENCOUNTER — Ambulatory Visit: Payer: BC Managed Care – PPO | Admitting: Family Medicine

## 2023-11-04 ENCOUNTER — Other Ambulatory Visit (INDEPENDENT_AMBULATORY_CARE_PROVIDER_SITE_OTHER): Payer: BC Managed Care – PPO

## 2023-11-04 VITALS — BP 118/72 | HR 87 | Temp 98.0°F | Resp 16 | Ht 64.0 in | Wt 143.0 lb

## 2023-11-04 DIAGNOSIS — Z0000 Encounter for general adult medical examination without abnormal findings: Secondary | ICD-10-CM

## 2023-11-04 DIAGNOSIS — Z23 Encounter for immunization: Secondary | ICD-10-CM | POA: Diagnosis not present

## 2023-11-04 LAB — COMPREHENSIVE METABOLIC PANEL
ALT: 19 U/L (ref 0–35)
AST: 19 U/L (ref 0–37)
Albumin: 4.2 g/dL (ref 3.5–5.2)
Alkaline Phosphatase: 35 U/L — ABNORMAL LOW (ref 39–117)
BUN: 12 mg/dL (ref 6–23)
CO2: 31 meq/L (ref 19–32)
Calcium: 9.5 mg/dL (ref 8.4–10.5)
Chloride: 102 meq/L (ref 96–112)
Creatinine, Ser: 0.87 mg/dL (ref 0.40–1.20)
GFR: 93.37 mL/min (ref >60.00)
Glucose, Bld: 59 mg/dL — ABNORMAL LOW (ref 70–99)
Potassium: 4.7 meq/L (ref 3.5–5.1)
Sodium: 138 meq/L (ref 135–145)
Total Bilirubin: 0.7 mg/dL (ref 0.2–1.2)
Total Protein: 7.1 g/dL (ref 6.0–8.3)

## 2023-11-04 LAB — LIPID PANEL
Cholesterol: 169 mg/dL (ref 0–200)
HDL: 90.5 mg/dL (ref >39.00)
LDL Cholesterol: 66 mg/dL (ref 0–99)
NonHDL: 78.16
Total CHOL/HDL Ratio: 2
Triglycerides: 59 mg/dL (ref 0.0–149.0)
VLDL: 11.8 mg/dL (ref 0.0–40.0)

## 2023-11-04 LAB — CBC
HCT: 40.1 % (ref 36.0–46.0)
Hemoglobin: 12.8 g/dL (ref 12.0–15.0)
MCHC: 31.8 g/dL (ref 30.0–36.0)
MCV: 85.4 fL (ref 78.0–100.0)
Platelets: 341 10*3/uL (ref 150.0–400.0)
RBC: 4.69 Mil/uL (ref 3.87–5.11)
RDW: 18.3 % — ABNORMAL HIGH (ref 11.5–15.5)
WBC: 7.5 10*3/uL (ref 4.0–10.5)

## 2023-11-04 NOTE — Progress Notes (Signed)
Chief Complaint  Patient presents with   Annual Exam    Annual Exam     Well Woman Susan Galloway is here for a complete physical.   Her last physical was >1 year ago.  Current diet: in general, a "healthy" diet. Current exercise: dancing. Fatigue out of ordinary? No Seatbelt? Yes Advanced directive? No  Health Maintenance Pap/HPV- Yes Tetanus- Yes HIV screening- Yes Hep C screening- Yes  Past Medical History:  Diagnosis Date   Constipation    Environmental allergies    Seasonal allergies      Past Surgical History:  Procedure Laterality Date   Dental surgery      Medications  Current Outpatient Medications on File Prior to Visit  Medication Sig Dispense Refill   albuterol (VENTOLIN HFA) 108 (90 Base) MCG/ACT inhaler Inhale 2 puffs into the lungs every 6 (six) hours as needed for wheezing or shortness of breath. 8 g 0    Allergies Allergies  Allergen Reactions   Sulfa Antibiotics Hives    Review of Systems: Constitutional:  no unexpected weight changes Eye:  no recent significant change in vision Ear/Nose/Mouth/Throat:  Ears:  no tinnitus or vertigo and no recent change in hearing Nose/Mouth/Throat:  no complaints of nasal congestion, no sore throat Cardiovascular: no chest pain Respiratory:  no cough and no shortness of breath Gastrointestinal:  no abdominal pain, no change in bowel habits GU:  Female: negative for dysuria or pelvic pain Musculoskeletal/Extremities:  no pain of the joints Integumentary (Skin/Breast):  no abnormal skin lesions reported Neurologic:  no headaches Endocrine:  denies fatigue Hematologic/Lymphatic:  No areas of easy bleeding  Exam BP 118/72 (BP Location: Left Arm, Patient Position: Sitting, Cuff Size: Normal)   Pulse 87   Temp 98 F (36.7 C) (Oral)   Resp 16   Ht 5\' 4"  (1.626 m)   Wt 143 lb (64.9 kg)   SpO2 99%   BMI 24.55 kg/m  General:  well developed, well nourished, in no apparent distress Skin:  no significant  moles, warts, or growths Head:  no masses, lesions, or tenderness Eyes:  pupils equal and round, sclera anicteric without injection Ears:  canals without lesions, TMs shiny without retraction, no obvious effusion, no erythema Nose:  nares patent, mucosa normal, and no drainage  Throat/Pharynx:  lips and gingiva without lesion; tongue and uvula midline; non-inflamed pharynx; no exudates or postnasal drainage Neck: neck supple without adenopathy, thyromegaly, or masses Lungs:  clear to auscultation, breath sounds equal bilaterally, no respiratory distress Cardio:  regular rate and rhythm, no bruits, no LE edema Abdomen:  abdomen soft, nontender; bowel sounds normal; no masses or organomegaly Genital: Defer to GYN Musculoskeletal:  symmetrical muscle groups noted without atrophy or deformity Extremities:  no clubbing, cyanosis, or edema, no deformities, no skin discoloration Neuro:  gait normal; deep tendon reflexes normal and symmetric Psych: well oriented with normal range of affect and appropriate judgment/insight  Assessment and Plan  Well adult exam - Plan: CBC, Comprehensive metabolic panel, Lipid panel   Well 22 y.o. female. Counseled on diet and exercise. Advanced directive form provided today.  Flu shot today.  Other orders as above. Follow up in 1 yr. The patient voiced understanding and agreement to the plan.  [name] [place], DO 11/04/23 1:25 PM

## 2023-11-04 NOTE — Patient Instructions (Addendum)
Give us 2-3 business days to get the results of your labs back.   Keep the diet clean and stay active.  Please get me a copy of your advanced directive form at your convenience.   Let us know if you need anything.  

## 2023-11-05 ENCOUNTER — Other Ambulatory Visit (INDEPENDENT_AMBULATORY_CARE_PROVIDER_SITE_OTHER): Payer: BC Managed Care – PPO

## 2023-11-05 ENCOUNTER — Other Ambulatory Visit: Payer: Self-pay | Admitting: *Deleted

## 2023-11-05 DIAGNOSIS — D649 Anemia, unspecified: Secondary | ICD-10-CM

## 2023-11-05 LAB — IBC + FERRITIN
Ferritin: 6 ng/mL — ABNORMAL LOW (ref 10.0–291.0)
Iron: 34 ug/dL — ABNORMAL LOW (ref 42–145)
Saturation Ratios: 6.4 % — ABNORMAL LOW (ref 20.0–50.0)
TIBC: 527.8 ug/dL — ABNORMAL HIGH (ref 250.0–450.0)
Transferrin: 377 mg/dL — ABNORMAL HIGH (ref 212.0–360.0)

## 2023-11-06 ENCOUNTER — Other Ambulatory Visit: Payer: Self-pay

## 2023-11-06 DIAGNOSIS — D649 Anemia, unspecified: Secondary | ICD-10-CM

## 2023-12-02 ENCOUNTER — Encounter: Payer: Self-pay | Admitting: Family Medicine

## 2023-12-07 ENCOUNTER — Other Ambulatory Visit (INDEPENDENT_AMBULATORY_CARE_PROVIDER_SITE_OTHER): Payer: BC Managed Care – PPO

## 2023-12-07 DIAGNOSIS — D649 Anemia, unspecified: Secondary | ICD-10-CM

## 2023-12-07 LAB — CBC WITH DIFFERENTIAL/PLATELET
Basophils Absolute: 0.1 10*3/uL (ref 0.0–0.1)
Basophils Relative: 1.4 % (ref 0.0–3.0)
Eosinophils Absolute: 0.3 10*3/uL (ref 0.0–0.7)
Eosinophils Relative: 4.1 % (ref 0.0–5.0)
HCT: 42.2 % (ref 36.0–46.0)
Hemoglobin: 13.5 g/dL (ref 12.0–15.0)
Lymphocytes Relative: 40.9 % (ref 12.0–46.0)
Lymphs Abs: 2.6 10*3/uL (ref 0.7–4.0)
MCHC: 32 g/dL (ref 30.0–36.0)
MCV: 87.9 fL (ref 78.0–100.0)
Monocytes Absolute: 0.5 10*3/uL (ref 0.1–1.0)
Monocytes Relative: 7.3 % (ref 3.0–12.0)
Neutro Abs: 2.9 10*3/uL (ref 1.4–7.7)
Neutrophils Relative %: 46.3 % (ref 43.0–77.0)
Platelets: 341 10*3/uL (ref 150.0–400.0)
RBC: 4.8 Mil/uL (ref 3.87–5.11)
RDW: 18.9 % — ABNORMAL HIGH (ref 11.5–15.5)
WBC: 6.3 10*3/uL (ref 4.0–10.5)

## 2023-12-08 ENCOUNTER — Other Ambulatory Visit: Payer: Self-pay

## 2023-12-08 DIAGNOSIS — D649 Anemia, unspecified: Secondary | ICD-10-CM

## 2023-12-08 LAB — IRON,TIBC AND FERRITIN PANEL
%SAT: 58 % — ABNORMAL HIGH (ref 16–45)
Ferritin: 10 ng/mL — ABNORMAL LOW (ref 16–154)
Iron: 230 ug/dL — ABNORMAL HIGH (ref 40–190)
TIBC: 395 ug/dL (ref 250–450)

## 2023-12-09 NOTE — Addendum Note (Signed)
Addended by: Kathi Ludwig on: 12/09/2023 09:15 AM   Modules accepted: Orders

## 2024-01-25 ENCOUNTER — Encounter: Payer: Self-pay | Admitting: Family Medicine

## 2024-01-25 ENCOUNTER — Other Ambulatory Visit (HOSPITAL_COMMUNITY)
Admission: RE | Admit: 2024-01-25 | Discharge: 2024-01-25 | Disposition: A | Discharge status: Home / Self Care | Payer: BC Managed Care – PPO | Source: Ambulatory Visit | Attending: Family Medicine | Admitting: Family Medicine

## 2024-01-25 ENCOUNTER — Ambulatory Visit (INDEPENDENT_AMBULATORY_CARE_PROVIDER_SITE_OTHER): Payer: BC Managed Care – PPO | Admitting: Family Medicine

## 2024-01-25 VITALS — BP 110/70 | HR 85 | Temp 97.9°F | Resp 16 | Ht 64.0 in | Wt 149.2 lb

## 2024-01-25 DIAGNOSIS — G8929 Other chronic pain: Secondary | ICD-10-CM

## 2024-01-25 DIAGNOSIS — N898 Other specified noninflammatory disorders of vagina: Secondary | ICD-10-CM | POA: Insufficient documentation

## 2024-01-25 DIAGNOSIS — R8290 Unspecified abnormal findings in urine: Secondary | ICD-10-CM | POA: Diagnosis not present

## 2024-01-25 DIAGNOSIS — M25551 Pain in right hip: Secondary | ICD-10-CM | POA: Diagnosis not present

## 2024-01-25 DIAGNOSIS — M25552 Pain in left hip: Secondary | ICD-10-CM | POA: Diagnosis not present

## 2024-01-25 DIAGNOSIS — M25561 Pain in right knee: Secondary | ICD-10-CM | POA: Diagnosis not present

## 2024-01-25 DIAGNOSIS — M25562 Pain in left knee: Secondary | ICD-10-CM

## 2024-01-25 LAB — CBC
HCT: 45.1 % (ref 36.0–46.0)
Hemoglobin: 14.7 g/dL (ref 12.0–15.0)
MCHC: 32.7 g/dL (ref 30.0–36.0)
MCV: 90.5 fL (ref 78.0–100.0)
Platelets: 306 10*3/uL (ref 150.0–400.0)
RBC: 4.98 Mil/uL (ref 3.87–5.11)
RDW: 15.7 % — ABNORMAL HIGH (ref 11.5–15.5)
WBC: 4.9 10*3/uL (ref 4.0–10.5)

## 2024-01-25 LAB — URINALYSIS, ROUTINE W REFLEX MICROSCOPIC
Bilirubin Urine: NEGATIVE
Hgb urine dipstick: NEGATIVE
Ketones, ur: NEGATIVE
Nitrite: NEGATIVE
RBC / HPF: NONE SEEN (ref 0–?)
Specific Gravity, Urine: <=1.005 — AB (ref 1.000–1.030)
Total Protein, Urine: NEGATIVE
Urine Glucose: NEGATIVE
Urobilinogen, UA: 0.2 (ref 0.0–1.0)
pH: 6.5 (ref 5.0–8.0)

## 2024-01-25 LAB — URIC ACID: Uric Acid, Serum: 4.6 mg/dL (ref 2.4–7.0)

## 2024-01-25 LAB — VITAMIN D 25 HYDROXY (VIT D DEFICIENCY, FRACTURES): VITD: 8.41 ng/mL — ABNORMAL LOW (ref 30.00–100.00)

## 2024-01-25 LAB — SEDIMENTATION RATE: Sed Rate: 21 mm/h — ABNORMAL HIGH (ref 0–20)

## 2024-01-25 NOTE — Progress Notes (Signed)
Chief Complaint  Patient presents with   Temporomandibular Joint Pain    Joint pain and fatigue    Subjective: Patient is a 23 y.o. female here for diffuse jt pain.  Knees, hips, hands, shoulder pain, mainly when she gets cold. No obvious injury or change in activity. Has been going on for 5-6 years, steadily worsening. Heat seems to help. NSAIDs and Tylenol are temporary relieving factors. No bruising, redness, swelling.   She admits she does not drink much water.  She has had a foul odor, though not fishy, of vaginal discharge.  She is not sexually active.  No bleeding or pain.  Urine is also malodorous.  No pain, discharge, frequency, urgency, hesitancy, or retention otherwise.  No fevers.  Past Medical History:  Diagnosis Date   Constipation    Environmental allergies    Seasonal allergies     Objective: BP 110/70   Pulse 85   Temp 97.9 F (36.6 C) (Oral)   Resp 16   Ht 5\' 4"  (1.626 m)   Wt 149 lb 3.2 oz (67.7 kg)   SpO2 98%   BMI 25.61 kg/m  General: Awake, appears stated age Abdomen: Bowel sounds present, soft, nontender, nondistended MSK: Hypertonicity over the right trapezius musculature; no TTP over the traps, cervical/thoracic/lumbar paraspinal musculature.  Mild TTP over the proximal lateral glutes without bony tenderness.  There is TTP over the lateral epicondyles bilaterally and bilateral patellar tendons.  No deformity. Neuro: Gait is normal.  Grip strength is adequate.  DTRs equal and symmetric throughout.  No clonus, no cerebellar signs. 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  Will rule out autoimmune involvement.  Ck urine and self swab but probably needs to hydrate more.  Follow-up pending the above. The patient voiced understanding and agreement to the plan.  I spent 31 minutes with the patient discussing the above plans in addition to reviewing her chart on the same day of the visit.  [name] [place], DO 01/25/24  11:33 AM

## 2024-01-25 NOTE — Addendum Note (Signed)
Addended by: Kathi Ludwig on: 01/25/2024 12:05 PM   Modules accepted: Orders

## 2024-01-25 NOTE — Patient Instructions (Addendum)
Give [name] 4-5 business days to get the results of your labs back.   Try to drink 55-60 oz of water daily outside of exercise.  Foods that may reduce pain: 1) Ginger 2) Blueberries 3) Salmon 4) Pumpkin seeds 5) Dark chocolate 6) Turmeric 7) Tart cherries 8) Virgin olive oil 9) Chili peppers 10) Mint 11) Krill oil  Let us know if you need anything.

## 2024-01-26 ENCOUNTER — Other Ambulatory Visit (HOSPITAL_BASED_OUTPATIENT_CLINIC_OR_DEPARTMENT_OTHER): Payer: Self-pay

## 2024-01-26 ENCOUNTER — Other Ambulatory Visit: Payer: Self-pay | Admitting: Family Medicine

## 2024-01-26 ENCOUNTER — Encounter: Payer: Self-pay | Admitting: Family Medicine

## 2024-01-26 LAB — URINE CULTURE
MICRO NUMBER:: 16001257
Result:: NO GROWTH
SPECIMEN QUALITY:: ADEQUATE

## 2024-01-26 LAB — CERVICOVAGINAL ANCILLARY ONLY
Bacterial Vaginitis (gardnerella): POSITIVE — AB
Candida Glabrata: NEGATIVE
Candida Vaginitis: NEGATIVE
Comment: NEGATIVE
Comment: NEGATIVE
Comment: NEGATIVE
Comment: NEGATIVE
Trichomonas: NEGATIVE

## 2024-01-26 LAB — ANTI-SMITH ANTIBODY: ENA SM Ab Ser-aCnc: <1 AI

## 2024-01-26 MED ORDER — METRONIDAZOLE 500 MG PO TABS
500.0000 mg | ORAL_TABLET | Freq: Two times a day (BID) | ORAL | 0 refills | Status: AC
Start: 2024-01-26 — End: 2024-02-03
  Filled 2024-01-26: qty 14, 7d supply, fill #0

## 2024-01-26 MED ORDER — VITAMIN D (ERGOCALCIFEROL) 1.25 MG (50000 UNIT) PO CAPS
50000.0000 [IU] | ORAL_CAPSULE | ORAL | 0 refills | Status: AC
  Filled 2024-01-26: qty 12, 84d supply, fill #0

## 2024-01-27 ENCOUNTER — Other Ambulatory Visit: Payer: Self-pay

## 2024-01-27 DIAGNOSIS — E559 Vitamin D deficiency, unspecified: Secondary | ICD-10-CM

## 2024-01-27 LAB — ANA,IFA RA DIAG PNL W/RFLX TIT/PATN
Cyclic Citrullin Peptide Ab: <16 U
Rheumatoid fact SerPl-aCnc: <10 [IU]/mL (ref <14)
Rheumatoid fact SerPl-aCnc: NEGATIVE [IU]/mL

## 2024-01-27 LAB — SJOGRENS SYNDROME-B EXTRACTABLE NUCLEAR ANTIBODY: SSB (La) (ENA) Antibody, IgG: <1 AI

## 2024-01-27 LAB — SJOGRENS SYNDROME-A EXTRACTABLE NUCLEAR ANTIBODY: SSA (Ro) (ENA) Antibody, IgG: <1 AI

## 2024-01-27 LAB — ALDOLASE: Aldolase: 4 U/L (ref <=8.1)

## 2024-01-27 LAB — ANTI-DNA ANTIBODY, DOUBLE-STRANDED: ds DNA Ab: <1 [IU]/mL

## 2024-04-18 ENCOUNTER — Other Ambulatory Visit: Payer: BC Managed Care – PPO

## 2024-05-17 ENCOUNTER — Encounter: Payer: Self-pay | Admitting: Family Medicine

## 2024-05-25 ENCOUNTER — Ambulatory Visit (INDEPENDENT_AMBULATORY_CARE_PROVIDER_SITE_OTHER): Admitting: Family Medicine

## 2024-05-25 ENCOUNTER — Encounter: Payer: Self-pay | Admitting: Family Medicine

## 2024-05-25 VITALS — BP 112/72 | HR 82 | Temp 98.0°F | Resp 16 | Ht 64.0 in | Wt 151.0 lb

## 2024-05-25 DIAGNOSIS — K9041 Non-celiac gluten sensitivity: Secondary | ICD-10-CM | POA: Diagnosis not present

## 2024-05-25 DIAGNOSIS — Z9109 Other allergy status, other than to drugs and biological substances: Secondary | ICD-10-CM

## 2024-05-25 DIAGNOSIS — J020 Streptococcal pharyngitis: Secondary | ICD-10-CM

## 2024-05-25 MED ORDER — AMOXICILLIN 500 MG PO CAPS: 1000.0000 mg | ORAL_CAPSULE | Freq: Every day | ORAL | 0 refills | Status: AC

## 2024-05-25 NOTE — Patient Instructions (Addendum)
 Throw out your toothbrush after 24 hours on antibiotics.   Ibuprofen  400-600 mg (2-3 over the counter strength tabs) every 6 hours as needed for pain.  OK to take Tylenol  1000 mg (2 extra strength tabs) or 975 mg (3 regular strength tabs) every 6 hours as needed.  Consider throat lozenges, salt water gargles and an air humidifier for symptomatic care.   If you do not hear anything about your referral in the next 1-2 weeks, call our office and ask for an update.  Give us  4-5 business days to get the results of your labs back.   Let us  know if you need anything.

## 2024-05-25 NOTE — Progress Notes (Signed)
 Chief Complaint  Patient presents with   Sore Throat    Sore throat  and Cough    Susan Galloway here for URI complaints.  Duration: 1 day  Associated symptoms: Fever (100 F), sinus congestion, sinus pain, rhinorrhea, itchy watery eyes, ear fullness, sore throat, myalgia, and coughing that is productive, upper dental pain, sneezing Denies: ear pain, ear drainage, wheezing, shortness of breath, and N/V/D, lack of smell/taste Treatment to date: Zyrtec Sick contacts: No  Past Medical History:  Diagnosis Date   Constipation    Environmental allergies    Seasonal allergies     Objective BP 112/72 (BP Location: Left Arm, Patient Position: Sitting)   Pulse 82   Temp 98 F (36.7 C) (Oral)   Resp 16   Ht 5\' 4"  (1.626 m)   Wt 151 lb (68.5 kg)   SpO2 98%   BMI 25.92 kg/m  General: Awake, alert, appears stated age HEENT: AT, [place], ears patent b/l and TM's neg, nares patent w/o discharge, pharynx erythematous but without exudates, MMM, no sinus TTP Neck: No masses or asymmetry Heart: RRR Lungs: CTAB, no accessory muscle use Psych: Age appropriate judgment and insight, normal mood and affect  Strep throat  Environmental allergies - Plan: Ambulatory referral to Allergy  Gluten intolerance - Plan: Gliadin antibodies, serum, Tissue transglutaminase, IgA, Reticulin Antibody, IgA w reflex titer  1/4 Centor criteria.  Rapid strep test positive.  10 days of amoxicillin  1 g daily.  Throw toothbrush after 24 hours of being on antibiotics.  Letter for work provided.  Continue to push fluids, practice good hand hygiene, cover mouth when coughing. F/u prn. If starting to experience fevers, shaking, or shortness of breath, seek immediate care. Patient requested referral to the allergy team. Check above. Pt voiced understanding and agreement to the plan.  Shellie Dials [place], DO 05/25/24 2:40 PM

## 2024-05-26 LAB — GLIADIN ANTIBODIES, SERUM
Gliadin IgA: <1 U/mL
Gliadin IgG: <1 U/mL

## 2024-05-26 LAB — TISSUE TRANSGLUTAMINASE, IGA: (tTG) Ab, IgA: <1 U/mL

## 2024-05-27 ENCOUNTER — Ambulatory Visit: Payer: Self-pay | Admitting: Family Medicine

## 2024-05-27 LAB — RETICULIN ANTIBODIES, IGA W TITER: Reticulin Ab, IgA: NEGATIVE {titer} (ref <2.5)

## 2024-05-28 ENCOUNTER — Encounter: Payer: Self-pay | Admitting: Family Medicine

## 2024-08-09 ENCOUNTER — Ambulatory Visit (INDEPENDENT_AMBULATORY_CARE_PROVIDER_SITE_OTHER): Admitting: Family Medicine

## 2024-08-09 ENCOUNTER — Encounter: Payer: Self-pay | Admitting: Family Medicine

## 2024-08-09 VITALS — BP 118/78 | HR 70 | Temp 97.8°F | Resp 16 | Ht 64.0 in | Wt 154.6 lb

## 2024-08-09 DIAGNOSIS — J029 Acute pharyngitis, unspecified: Secondary | ICD-10-CM | POA: Diagnosis not present

## 2024-08-09 LAB — POCT RAPID STREP A (OFFICE): Rapid Strep A Screen: NEGATIVE

## 2024-08-09 MED ORDER — METHYLPREDNISOLONE ACETATE 80 MG/ML IJ SUSP
80.0000 mg | Freq: Once | INTRAMUSCULAR | Status: AC
  Administered 2024-08-09 (×2): 80 mg via INTRAMUSCULAR

## 2024-08-09 NOTE — Addendum Note (Signed)
 Addended by: Shanae Luo M on: 08/09/2024 09:25 AM   Modules accepted: Orders

## 2024-08-09 NOTE — Addendum Note (Signed)
 Addended by: Andreus Cure M on: 08/09/2024 10:56 AM   Modules accepted: Orders

## 2024-08-09 NOTE — Progress Notes (Signed)
 SUBJECTIVE:   Susan Galloway is a 23 y.o. female presents to the clinic for:  Chief Complaint  Patient presents with   Sore Throat    Sore Throat     Complains of sore throat for 3 days.  Other associated symptoms: sore throat.  Denies: sinus congestion, sinus pain, rhinorrhea, itchy watery eyes, ear pain, ear drainage, wheezing, shortness of breath, myalgia, and fevers Sick Contacts: none known Therapy to date: lozenges, throat spray  Social History   Tobacco Use  Smoking Status Never  Smokeless Tobacco Never    Patient's medications, allergies, past medical, surgical, social and family histories were reviewed and updated as appropriate.  OBJECTIVE:  BP 118/78 (BP Location: Left Arm, Patient Position: Sitting)   Pulse 70   Temp 97.8 F (36.6 C) (Oral)   Resp 16   Ht 5' 4 (1.626 m)   Wt 154 lb 9.6 oz (70.1 kg)   SpO2 95%   BMI 26.54 kg/m  General: Awake, alert, appearing stated age Eyes: conjunctivae and sclerae clear Ears: normal TMs bilaterally Nose: no visible exudate Oropharynx: lips, mucosa, and tongue normal; teeth and gums normal and a hyperemic pharynx without drainage; no pharyngeal/tonsillar exudate or erythema Neck: supple, no significant adenopathy Lungs: clear to auscultation, no wheezes, rales or rhonchi, symmetric air entry, normal effort Heart: RRR Skin:reveals no rash Psych: Age appropriate judgment and insight  ASSESSMENT/PLAN:  Sore throat  Depo-Medrol  injection today.  Continue to practice good hand hygiene and push fluids. Acetaminophen  for pain. Send message if no significant improvement in the next 2 days. F/u prn. Pt voiced understanding and agreement to the plan.  Mabel Mt [place], DO 08/09/24 8:47 AM

## 2024-08-09 NOTE — Patient Instructions (Addendum)
 OK to take Tylenol  1000 mg (2 extra strength tabs) or 975 mg (3 regular strength tabs) every 6 hours as needed.  Consider throat lozenges, salt water gargles and an air humidifier for symptomatic care.   Send me a message in 2 days if not improving.   Let us  know if you need anything.

## 2024-08-10 ENCOUNTER — Encounter: Payer: Self-pay | Admitting: Family Medicine

## 2024-08-10 ENCOUNTER — Other Ambulatory Visit (HOSPITAL_BASED_OUTPATIENT_CLINIC_OR_DEPARTMENT_OTHER): Payer: Self-pay

## 2024-08-10 ENCOUNTER — Other Ambulatory Visit: Payer: Self-pay | Admitting: Family Medicine

## 2024-08-10 MED ORDER — AMOXICILLIN 500 MG PO CAPS
1000.0000 mg | ORAL_CAPSULE | Freq: Every day | ORAL | 0 refills | Status: AC
  Filled 2024-08-10: qty 20, 10d supply, fill #0

## 2024-08-10 MED ORDER — FLUCONAZOLE 150 MG PO TABS
ORAL_TABLET | ORAL | 0 refills | Status: AC
  Filled 2024-08-10 (×2): qty 2, 4d supply, fill #0

## 2024-09-15 ENCOUNTER — Telehealth

## 2024-09-15 DIAGNOSIS — N760 Acute vaginitis: Secondary | ICD-10-CM

## 2024-09-15 DIAGNOSIS — B9689 Other specified bacterial agents as the cause of diseases classified elsewhere: Secondary | ICD-10-CM

## 2024-09-16 MED ORDER — METRONIDAZOLE 500 MG PO TABS: 500.0000 mg | ORAL_TABLET | Freq: Three times a day (TID) | ORAL | 0 refills | Status: AC

## 2024-09-16 NOTE — Progress Notes (Signed)
 I have spent 5 minutes in review of e-visit questionnaire, review and updating patient chart, medical decision making and response to patient.   Laure Kidney, PA-C

## 2024-09-16 NOTE — Progress Notes (Signed)

## 2024-10-05 DIAGNOSIS — M545 Low back pain, unspecified: Secondary | ICD-10-CM | POA: Diagnosis not present

## 2024-10-05 DIAGNOSIS — R10A1 Flank pain, right side: Secondary | ICD-10-CM | POA: Diagnosis not present

## 2024-10-06 DIAGNOSIS — R10A1 Flank pain, right side: Secondary | ICD-10-CM | POA: Diagnosis not present

## 2024-11-23 DIAGNOSIS — R140 Abdominal distension (gaseous): Secondary | ICD-10-CM | POA: Diagnosis not present

## 2024-12-12 ENCOUNTER — Ambulatory Visit: Admitting: Sports Medicine

## 2024-12-12 VITALS — BP 137/84 | HR 97 | Temp 98.3°F | Wt 154.0 lb

## 2024-12-12 DIAGNOSIS — Z3202 Encounter for pregnancy test, result negative: Secondary | ICD-10-CM | POA: Diagnosis not present

## 2024-12-12 DIAGNOSIS — H9203 Otalgia, bilateral: Secondary | ICD-10-CM

## 2024-12-12 DIAGNOSIS — J3489 Other specified disorders of nose and nasal sinuses: Secondary | ICD-10-CM | POA: Diagnosis not present

## 2024-12-12 DIAGNOSIS — J029 Acute pharyngitis, unspecified: Secondary | ICD-10-CM

## 2024-12-12 LAB — POCT RAPID STREP A (OFFICE): Rapid Strep A Screen: NEGATIVE

## 2024-12-12 LAB — POCT INFLUENZA A/B
Influenza A, POC: NEGATIVE
Influenza B, POC: NEGATIVE

## 2024-12-12 LAB — POCT URINE PREGNANCY: Preg Test, Ur: NEGATIVE

## 2024-12-12 LAB — POC COVID19 BINAXNOW: SARS Coronavirus 2 Ag: NEGATIVE

## 2024-12-12 MED ORDER — DOXYCYCLINE HYCLATE 100 MG PO TABS: 100.0000 mg | ORAL_TABLET | Freq: Two times a day (BID) | ORAL | 0 refills | Status: AC

## 2024-12-12 NOTE — Progress Notes (Addendum)
 "  Careteam: Patient Care Team: [name], DO as PCP - General (Family Medicine)  Allergies[1]  Chief Complaint  Patient presents with   Sore Throat    Pt has been having a sore throat for about a week now. She denied any other symptoms    Discussed the use of AI scribe software for clinical note transcription with the patient, who gave verbal consent to proceed.  History of Present Illness  Susan Galloway is a 23 year old female who presents with a sore throat persisting for over a week.  She has experienced a sore throat for over a week, which is persistent and most noticeable upon waking. The soreness does not improve throughout the day and is described as 'sore, sore, sore', differing from her usual episodes of strep throat, which are typically itchy.  She experiences postnasal drip and congestion. No fever, runny nose, or sinus congestion is reported, but she has ear pain in both ears without drainage or ringing, accompanied by a headache.  Her appetite remains decent, with a preference for warm foods and drinks like oatmeal and tea to soothe her throat. No nausea, vomiting, urinary symptoms, or gastrointestinal issues.  She has tried several over-the-counter remedies including, Theraflu, Mucinex, Tylenol , ibuprofen , and lozenges. She traveled to [facility] recently but had the sore throat prior to the trip. No one at home is reported to be sick.    Review of Systems:  Review of Systems  Constitutional:  Negative for chills and fever.  HENT:  Positive for ear pain and sore throat. Negative for congestion, ear discharge and sinus pain.   Eyes:  Negative for blurred vision.  Respiratory:  Negative for cough, sputum production and shortness of breath.   Cardiovascular:  Negative for chest pain, palpitations and leg swelling.  Gastrointestinal:  Negative for abdominal pain, heartburn and nausea.  Genitourinary:  Negative for dysuria, frequency and hematuria.   Musculoskeletal:  Negative for falls and myalgias.  Neurological:  Negative for dizziness, sensory change, focal weakness and headaches.   Negative unless indicated in HPI.   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   Acne 10/18/2018   Chronic fatigue 11/11/2015   Paresthesias 11/11/2015   Gastroesophageal reflux disease 09/03/2015   Allergic rhinitis, seasonal 04/09/2014   Past Medical History:  Diagnosis Date   Constipation    Environmental allergies    Seasonal allergies    Past Surgical History:  Procedure Laterality Date   Dental surgery     Social History[2] Family History  Problem Relation Age of Onset   Arthritis Mother        Living   Diabetes Mother    Hypertension Mother    Hyperlipidemia Mother    Thyroid  disease Mother    Hypothyroidism Mother    Hypertension Father    Hyperlipidemia Father        Living   Kidney disease Sister    Crohn's disease Brother    Hyperlipidemia Brother        #2   Hypertension Brother        #2   Allergies[3]  Medications: Patient's Medications  New Prescriptions   DOXYCYCLINE  (VIBRA -TABS) 100 MG TABLET    Take 1 tablet (100 mg total) by mouth 2 (two) times daily.  Previous Medications   ALBUTEROL  (VENTOLIN  HFA) 108 (90 BASE) MCG/ACT INHALER    Inhale  2 puffs into the lungs every 6 (six) hours as needed for wheezing or shortness of breath.   FLUCONAZOLE  (DIFLUCAN ) 150 MG TABLET    Take 1 tablet (150 mg) by mouth THEN repeat in 72 hours if no improvement.   VITAMIN D , ERGOCALCIFEROL , (DRISDOL ) 1.25 MG (50000 UNIT) CAPS CAPSULE    Take 1 capsule (50,000 Units total) by mouth every 7 (seven) days.  Modified Medications   No medications on file  Discontinued Medications   No medications on file    Physical Exam: Vitals:   12/12/24 1120  BP: 137/84  Pulse: 97  Temp: 98.3 F (36.8 C)   TempSrc: Oral  SpO2: 98%  Weight: 154 lb (69.9 kg)   Body mass index is 26.43 kg/m. BP Readings from Last 3 Encounters:  12/12/24 137/84  08/09/24 118/78  05/25/24 112/72   Wt Readings from Last 3 Encounters:  12/12/24 154 lb (69.9 kg)  08/09/24 154 lb 9.6 oz (70.1 kg)  05/25/24 151 lb (68.5 kg)    Physical Exam Constitutional:      Appearance: Normal appearance.  HENT:     Head: Normocephalic and atraumatic.     Right Ear: Tympanic membrane normal.     Nose:     Comments: Turbinate hypertrophy left + Ethmoid sinus tenderness+    Mouth/Throat:     Pharynx: Posterior oropharyngeal erythema present. No oropharyngeal exudate.  Cardiovascular:     Rate and Rhythm: Normal rate and regular rhythm.  Pulmonary:     Effort: Pulmonary effort is normal. No respiratory distress.     Breath sounds: Normal breath sounds. No wheezing.  Abdominal:     General: Bowel sounds are normal. There is no distension.     Tenderness: There is no abdominal tenderness. There is no guarding or rebound.     Comments:    Musculoskeletal:        General: No swelling or tenderness.  Lymphadenopathy:     Cervical: No cervical adenopathy.  Skin:    General: Skin is dry.  Neurological:     Mental Status: She is alert. Mental status is at baseline.     Sensory: No sensory deficit.     Motor: No weakness.     Labs reviewed: Basic Metabolic Panel: No results for input(s): NA, K, CL, CO2, GLUCOSE, BUN, CREATININE, CALCIUM, MG, PHOS, TSH in the last 8760 hours. Liver Function Tests: No results for input(s): AST, ALT, ALKPHOS, BILITOT, PROT, ALBUMIN in the last 8760 hours. No results for input(s): LIPASE, AMYLASE in the last 8760 hours. No results for input(s): AMMONIA in the last 8760 hours. CBC: Recent Labs    01/25/24 1129  WBC 4.9  HGB 14.7  HCT 45.1  MCV 90.5  PLT 306.0   Lipid Panel: No results for input(s): CHOL, HDL, LDLCALC, TRIG,  CHOLHDL, LDLDIRECT in the last 8760 hours. TSH: No results for input(s): TSH in the last 8760 hours. A1C: No results found for: HGBA1C  Assessment & Plan Sore throat Flu/covid strep neg Instructed patient to use cephacol lozenges Use salt water gargling  Orders:   POC COVID-19 BinaxNow   POCT Influenza A/B   POCT rapid strep A   Epstein-Barr virus nuclear antigen antibody, IgG   POCT urine pregnancy will check urine pregnancy test to determine abx Otalgia of both ears No bulging or redness of TM Take tylenol  prn for pain    Frontal sinus pain Ethmoid sinus tenderness Will start doxycycline  Orders:   doxycycline  (VIBRA -TABS) 100 MG  tablet; Take 1 tablet (100 mg total) by mouth 2 (two) times daily.         [1]  Allergies Allergen Reactions   Sulfa Antibiotics Hives  [2]  Social History Tobacco Use   Smoking status: Never   Smokeless tobacco: Never  Vaping Use   Vaping status: Never Used  Substance Use Topics   Alcohol use: No    Alcohol/week: 0.0 standard drinks of alcohol   Drug use: No  [3]  Allergies Allergen Reactions   Sulfa Antibiotics Hives   "

## 2024-12-13 LAB — EPSTEIN-BARR VIRUS NUCLEAR ANTIGEN ANTIBODY, IGG: EBV NA IgG: 330 U/mL — ABNORMAL HIGH

## 2024-12-14 ENCOUNTER — Ambulatory Visit: Payer: Self-pay | Admitting: Sports Medicine

## 2025-01-09 ENCOUNTER — Telehealth: Admitting: Physician Assistant

## 2025-01-09 DIAGNOSIS — R3989 Other symptoms and signs involving the genitourinary system: Secondary | ICD-10-CM | POA: Diagnosis not present

## 2025-01-09 MED ORDER — CEPHALEXIN 500 MG PO CAPS: 500.0000 mg | ORAL_CAPSULE | Freq: Two times a day (BID) | ORAL | 0 refills | Status: AC

## 2025-01-09 NOTE — Progress Notes (Signed)

## 2025-01-09 NOTE — Progress Notes (Signed)
 Message sent to patient requesting further input regarding current symptoms. Awaiting patient response.
